# Patient Record
Sex: Male | Born: 2000 | Race: Black or African American | Hispanic: No | Marital: Single | Smoking: Never smoker
Health system: Southern US, Community
[De-identification: ages and names within clinical notes are randomized; demographics above are authoritative.]

## PROBLEM LIST (undated history)

## (undated) DIAGNOSIS — G8190 Hemiplegia, unspecified affecting unspecified side: Secondary | ICD-10-CM

## (undated) DIAGNOSIS — S069X9A Unspecified intracranial injury with loss of consciousness of unspecified duration, initial encounter: Secondary | ICD-10-CM

## (undated) DIAGNOSIS — S069XAA Unspecified intracranial injury with loss of consciousness status unknown, initial encounter: Secondary | ICD-10-CM

---
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
## 2023-01-20 ENCOUNTER — Emergency Department (HOSPITAL_COMMUNITY)
Admission: EM | Admit: 2023-01-20 | Discharge: 2023-01-20 | Disposition: A | Discharge status: Home / Self Care | Payer: Medicaid Other | Attending: Emergency Medicine | Admitting: Emergency Medicine

## 2023-01-20 ENCOUNTER — Other Ambulatory Visit: Payer: Self-pay

## 2023-01-20 DIAGNOSIS — L250 Unspecified contact dermatitis due to cosmetics: Secondary | ICD-10-CM

## 2023-01-20 DIAGNOSIS — R21 Rash and other nonspecific skin eruption: Secondary | ICD-10-CM | POA: Diagnosis present

## 2023-01-20 MED ORDER — MUPIROCIN CALCIUM 2 % EX CREA: 1.0000 | TOPICAL_CREAM | Freq: Two times a day (BID) | CUTANEOUS | 0 refills | Status: DC

## 2023-01-20 NOTE — ED Triage Notes (Signed)
Patient reports skin irritation/rashes at left axilla onset last week .

## 2023-01-20 NOTE — ED Provider Notes (Signed)
[place] EMERGENCY DEPARTMENT AT [facility] Provider Note   CSN: [id number] Arrival date & time: 01/20/23  2030     History  Chief Complaint  Patient presents with   Rash    Roger Washington is a 22 y.o. male.  Is ER with rash to left axilla labs 1 week, states it started when he changed deodorants and he noticed an irritated area, after couple of days he switched back to his regular deodorant but states area is now open and burning, denies any drainage or fevers.     Rash      Home Medications Prior to Admission medications   Medication Sig Start Date End Date Taking? Authorizing Provider  mupirocin cream (BACTROBAN) 2 % Apply 1 Application topically 2 (two) times daily. 01/20/23  Yes Gabrielle Mester A, PA-C  ibuprofen (ADVIL,MOTRIN) 400 MG tablet Take 1 tablet (400 mg total) by mouth every 6 (six) hours as needed. 03/16/18   Elpidio Anis, PA-C  OnabotulinumtoxinA (BOTOX IJ) Inject as directed. For left leg paralysis. Possibly at [facility]. Dr. Gwen Pounds.    [provider]  ondansetron (ZOFRAN ODT) 4 MG disintegrating tablet Take 1 tablet (4 mg total) by mouth every 8 (eight) hours as needed for nausea or vomiting. 09/09/18   Ree Shay, MD      Allergies    Patient has no known allergies.    Review of Systems   Review of Systems  Skin:  Positive for rash.    Physical Exam Updated Vital Signs BP 111/60 (BP Location: Right Arm)   Pulse 84   Temp 98.5 F (36.9 C) (Oral)   Resp 16   SpO2 99%  Physical Exam Vitals and nursing note reviewed.  Constitutional:      General: He is not in acute distress.    Appearance: He is well-developed.  HENT:     Head: Normocephalic and atraumatic.  Eyes:     Conjunctiva/sclera: Conjunctivae normal.  Cardiovascular:     Rate and Rhythm: Normal rate and regular rhythm.     Heart sounds: No murmur heard. Pulmonary:     Effort: Pulmonary effort is normal. No respiratory distress.     Breath  sounds: Normal breath sounds.  Abdominal:     Palpations: Abdomen is soft.     Tenderness: There is no abdominal tenderness.  Musculoskeletal:        General: No swelling.     Cervical back: Neck supple.  Skin:    General: Skin is warm and dry.     Capillary Refill: Capillary refill takes less than 2 seconds.     Comments: 1.5 cm area of open skin, no drainage, very mild overlying erythema, no drainage or induration.  There is mild tenderness.  Appears to have been prior vesicle and has ruptured  Neurological:     Mental Status: He is alert.  Psychiatric:        Mood and Affect: Mood normal.     ED Results / Procedures / Treatments   Labs (all labs ordered are listed, but only abnormal results are displayed) Labs Reviewed - No data to display  EKG None  Radiology No results found.  Procedures Procedures    Medications Ordered in ED Medications - No data to display  ED Course/ Medical Decision Making/ A&P                             Medical Decision  Making DDx: Contact dermatitis, allergic dermatitis, zoster, cellulitis, abscess, other  ED course: Patient here with rash to left axilla, appears to be a vesicle that has ruptured.  There is no crusting, no induration or drainage, he states it burns when anything touches it.  No other rashes, the right axilla is unaffected, no fever or chills, there is very mild erythema.  No associated adenopathy, no crusting or drainage.  I discussed that this may represent a healing area of contact dermatitis, given the mild erythema we will give him some bacitracin, keep the area clean and dry, come back if he has any swelling, drainage, increased pain or fevers or any other worsening symptoms.   Risk Prescription drug management.           Final Clinical Impression(s) / ED Diagnoses Final diagnoses:  Contact dermatitis due to cosmetics, unspecified contact dermatitis type    Rx / DC Orders ED Discharge Orders           Ordered    mupirocin cream (BACTROBAN) 2 %  2 times daily        01/20/23 2047              Josem Kaufmann 01/20/23 2123    Bethann Berkshire, MD 01/23/23 1132

## 2023-01-20 NOTE — Discharge Instructions (Addendum)
Likely broke out due to new deodorant.  Make sure you continue with your previous deodorant.  Since skin is open there I prescribed mupirocin ointment to help soothe the area and treat any mild infection.  If you get swelling, fevers, drainage or increased pain come back, otherwise follow-up close with your primary care doctor.  Keep the area clean and dry, avoid fabric rubbing/chaffing the area

## 2023-03-07 ENCOUNTER — Other Ambulatory Visit (HOSPITAL_COMMUNITY): Payer: Self-pay

## 2023-03-07 ENCOUNTER — Telehealth: Payer: Self-pay

## 2023-03-07 NOTE — Telephone Encounter (Signed)
[facility] Patient Advocate Encounter  Insurance verification completed.    The patient is insured through [place] [employer] and has a $0.00 copay.  We will continue to follow to see if copay assistance is needed.  Maryum Batterson, CPhT Specialty Pharmacy Patient Advocate [facility] Phone: [phone] Fax:  [phone]  

## 2023-03-14 ENCOUNTER — Ambulatory Visit: Payer: Medicaid Other

## 2023-03-14 ENCOUNTER — Encounter: Payer: Self-pay | Admitting: Family

## 2023-03-14 ENCOUNTER — Other Ambulatory Visit (HOSPITAL_COMMUNITY): Payer: Self-pay

## 2023-03-14 ENCOUNTER — Ambulatory Visit (INDEPENDENT_AMBULATORY_CARE_PROVIDER_SITE_OTHER): Payer: Medicaid Other | Admitting: Family

## 2023-03-14 ENCOUNTER — Other Ambulatory Visit: Payer: Self-pay

## 2023-03-14 ENCOUNTER — Telehealth: Payer: Self-pay

## 2023-03-14 ENCOUNTER — Ambulatory Visit (INDEPENDENT_AMBULATORY_CARE_PROVIDER_SITE_OTHER): Payer: Medicaid Other | Admitting: Pharmacist

## 2023-03-14 VITALS — BP 104/66 | HR 75 | Resp 16 | Ht 62.0 in | Wt 101.0 lb

## 2023-03-14 DIAGNOSIS — B20 Human immunodeficiency virus [HIV] disease: Secondary | ICD-10-CM | POA: Diagnosis present

## 2023-03-14 DIAGNOSIS — Z113 Encounter for screening for infections with a predominantly sexual mode of transmission: Secondary | ICD-10-CM

## 2023-03-14 DIAGNOSIS — Z79899 Other long term (current) drug therapy: Secondary | ICD-10-CM

## 2023-03-14 LAB — CBC WITH DIFFERENTIAL/PLATELET
Basophils Relative: 0.5 %
Eosinophils Absolute: 100 cells/uL (ref 15–500)
HCT: 40.3 % (ref 38.5–50.0)
Hemoglobin: 13.7 g/dL (ref 13.2–17.1)
Neutrophils Relative %: 63.4 %
RBC: 4.96 10*6/uL (ref 4.20–5.80)
Total Lymphocyte: 25.9 %
WBC: 3.7 10*3/uL — ABNORMAL LOW (ref 3.8–10.8)

## 2023-03-14 MED ORDER — BIKTARVY 50-200-25 MG PO TABS
1.0000 | ORAL_TABLET | Freq: Every day | ORAL | 2 refills | Status: DC
  Filled 2023-03-14 (×2): qty 30, 30d supply, fill #0
  Filled 2023-04-09: qty 30, 30d supply, fill #1
  Filled 2023-05-07: qty 30, 30d supply, fill #2

## 2023-03-14 NOTE — Assessment & Plan Note (Signed)
[name] is a 22 y/o AA male newly diagnosed with HIV disease with unclear risk factor and initial viral load of 315,000. No current symptoms. Discussed the basics of HIV including transmission, risks if left untreated, treatment options, lab work, financial assistance, and plan of care. Introduced to Engineer, technical sales, counseling, Artist, and Engineer, materials. Check lab work. Rapid start with Biktarvy. Met with Pharmacy Team as well. Plan for follow up in 1 month or sooner if needed.

## 2023-03-14 NOTE — Progress Notes (Signed)
Brief Narrative   Patient ID: Roger Washington, male    DOB: 04-04-2001, 22 y.o.   MRN: [id number]  [name] is a 22 y/o AA male diagnosed with HIV-1 disease in May 2024 with unknown risk factor. Initial viral load of 315,000 with CD4 count and remaining lab work pending. No opportunistic infection. Rapid start with Biktarvy.   Subjective:    Chief Complaint  Patient presents with   Establish Care    Prefers name brand drugs reacts neg to generic meds.    HIV Positive/AIDS    HPI:  [name] is a 22 y.o. 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   [name] is here today with his mother with his permission. Was recently seen for a primary care office visit and subsequently was positive for HIV. Lab work received shows a viral load of 315,000 from 02/14/23. Last negative HIV testing was likely with donated blood about 1 year ago. No current symptoms and denies any flu like illness recently. Has completed high school and is currently working detailing cars. No history of mental health concerns. Mother questions error in lab work. Has had family diagnosed with HIV that are now deceased.  Denies fevers, chills, night sweats, headaches, changes in vision, neck pain/stiffness, nausea, diarrhea, vomiting, lesions or rashes.  No Known Allergies    Outpatient Medications Prior to Visit  Medication Sig Dispense Refill   ibuprofen (ADVIL,MOTRIN) 400 MG tablet Take 1 tablet (400 mg total) by mouth every 6 (six) hours as needed. (Patient not taking: Reported on 03/14/2023) 30 tablet 0   mupirocin cream (BACTROBAN) 2 % Apply 1 Application topically 2 (two) times daily. (Patient not taking: Reported on 03/14/2023) 15 g 0   OnabotulinumtoxinA (BOTOX IJ) Inject as directed. For left leg paralysis. Possibly at [facility]. Dr. Gwen Pounds. (Patient not taking: Reported on 03/14/2023)      ondansetron (ZOFRAN ODT) 4 MG disintegrating tablet Take 1 tablet (4 mg total) by mouth every 8 (eight) hours as needed for nausea or vomiting. (Patient not taking: Reported on 03/14/2023) 8 tablet 0   No facility-administered medications prior to visit.     Past Medical History:  Diagnosis Date   Hemiparesis (HCC)    TBI (traumatic brain injury) (HCC)      History reviewed. No pertinent surgical history.    Review of Systems  Constitutional:  Negative for appetite change, chills, fatigue, fever and unexpected weight change.  Eyes:  Negative for visual disturbance.  Respiratory:  Negative for cough, chest tightness, shortness of breath and wheezing.   Cardiovascular:  Negative for chest pain and leg swelling.  Gastrointestinal:  Negative for abdominal pain, constipation, diarrhea, nausea and vomiting.  Genitourinary:  Negative for dysuria, flank pain, frequency, genital sores, hematuria and urgency.  Skin:  Negative for rash.  Allergic/Immunologic: Negative for immunocompromised state.  Neurological:  Negative for dizziness and headaches.      Objective:    BP 104/66   Pulse 75   Resp 16   Ht 5\' 2"  (1.575 m)   Wt 101 lb (45.8 kg)   SpO2 98%   BMI 18.47 kg/m  Nursing note and vital signs reviewed.  Physical Exam Constitutional:      General: He is not in acute distress.    Appearance: He is well-developed.  Eyes:     Conjunctiva/sclera: Conjunctivae normal.  Cardiovascular:     Rate and Rhythm:  Normal rate and regular rhythm.     Heart sounds: Normal heart sounds. No murmur heard.    No friction rub. No gallop.  Pulmonary:     Effort: Pulmonary effort is normal. No respiratory distress.     Breath sounds: Normal breath sounds. No wheezing or rales.  Chest:     Chest wall: No tenderness.  Abdominal:     General: Bowel sounds are normal.     Palpations: Abdomen is soft.     Tenderness: There is no abdominal tenderness.  Musculoskeletal:     Cervical back: Neck  supple.  Lymphadenopathy:     Cervical: No cervical adenopathy.  Skin:    General: Skin is warm and dry.     Findings: No rash.  Neurological:     Mental Status: He is alert and oriented to person, place, and time.  Psychiatric:        Behavior: Behavior normal.        Thought Content: Thought content normal.        Judgment: Judgment normal.         03/14/2023    9:23 AM  Depression screen PHQ 2/9  Decreased Interest 0  Down, Depressed, Hopeless 0  PHQ - 2 Score 0       Assessment & Plan:    Patient Active Problem List   Diagnosis Date Noted   HIV disease (HCC) 03/14/2023   Scoliosis of thoracolumbar spine 03/04/2017   Traumatic subdural hematoma, sequela (HCC) 03/04/2017   Anxiety and depression 03/04/2017   Spastic hemiplegia affecting nondominant side (HCC) 12/01/2013   Closed TBI (traumatic brain injury) (HCC) 12/01/2013   History of seizures 12/01/2013   Muscle spasticity 12/01/2013   Problems with learning 12/01/2013   Alteration of awareness 12/01/2013     Problem List Items Addressed This Visit       Other   HIV disease (HCC) - Primary    [name] is a 22 y/o AA male newly diagnosed with HIV disease with unclear risk factor and initial viral load of 315,000. No current symptoms. Discussed the basics of HIV including transmission, risks if left untreated, treatment options, lab work, financial assistance, and plan of care. Introduced to Engineer, technical sales, counseling, Artist, and Engineer, materials. Check lab work. Rapid start with Biktarvy. Met with Pharmacy Team as well. Plan for follow up in 1 month or sooner if needed.        Relevant Medications   bictegravir-emtricitabine-tenofovir AF (BIKTARVY) 50-200-25 MG TABS tablet   Other Relevant Orders   CBC WITH DIFFERENTIAL/PLATELET   COMPLETE METABOLIC PANEL WITH GFR   HEPATITIS B SURFACE ANTIGEN   HEPATITIS B SURFACE ANTIBODY   HEPATITIS B CORE ANTIBODY, TOTAL    HEPATITIS C ANTIBODY   HEPATITIS A ANTIBODY, TOTAL   URINALYSIS   HIV antibody   HLA B*5701   QuantiFERON-TB Gold Plus   HIV RNA, RTPCR W/R GT (RTI, PI,INT)   T-helper cells (CD4) count (not at [facility])   Other Visit Diagnoses     Pharmacologic therapy       Relevant Orders   LIPID PANEL   Screening for STDs (sexually transmitted diseases)       Relevant Orders   RPR        I have discontinued Chastin Roger Washington's OnabotulinumtoxinA (BOTOX IJ), ibuprofen, ondansetron, and mupirocin cream. I am also having him start on Biktarvy.   Meds ordered this encounter  Medications   bictegravir-emtricitabine-tenofovir AF (BIKTARVY) 50-200-25 MG  TABS tablet    Sig: Take 1 tablet by mouth daily.    Dispense:  30 tablet    Refill:  2    Order Specific Question:   Supervising Provider    Answer:   Judyann Munson [[id number]]     Follow-up: Return in about 1 month (around 04/13/2023), or if symptoms worsen or fail to improve.   Marcos Eke, MSN, FNP-C Nurse Practitioner [facility] for Infectious Disease [facility] Main number: [phone]

## 2023-03-14 NOTE — Progress Notes (Signed)
HPI: Roger Washington is a 22 y.o. male who presents to the [facility] clinic today to initiate care for a newly diagnosed HIV infection.  Patient Active Problem List   Diagnosis Date Noted   Scoliosis of thoracolumbar spine 03/04/2017   Traumatic subdural hematoma, sequela (HCC) 03/04/2017   Anxiety and depression 03/04/2017   Spastic hemiplegia affecting nondominant side (HCC) 12/01/2013   Closed TBI (traumatic brain injury) (HCC) 12/01/2013   History of seizures 12/01/2013   Muscle spasticity 12/01/2013   Problems with learning 12/01/2013   Alteration of awareness 12/01/2013    Patient's Medications  New Prescriptions   No medications on file  Previous Medications   BICTEGRAVIR-EMTRICITABINE-TENOFOVIR AF (BIKTARVY) 50-200-25 MG TABS TABLET    Take 1 tablet by mouth daily.  Modified Medications   No medications on file  Discontinued Medications   No medications on file    Allergies: No Known Allergies  Past Medical History: Past Medical History:  Diagnosis Date   Hemiparesis (HCC)    TBI (traumatic brain injury) (HCC)     Social History: Social History   Socioeconomic History   Marital status: Single    Spouse name: Single   Number of children: 0   Years of education: 12   Highest education level: Not on file  Occupational History   Not on file  Tobacco Use   Smoking status: Never    Passive exposure: Never   Smokeless tobacco: Never  Vaping Use   Vaping Use: Never used  Substance and Sexual Activity   Alcohol use: No   Drug use: No   Sexual activity: Not on file  Other Topics Concern   Not on file  Social History Narrative   He lives with his mom. He has one brother.   He enjoys video games, football, basketball.   Working detail cars.    Social Determinants of Health   Financial Resource Strain: Not on file  Food Insecurity: Not on file  Transportation Needs: Not on file  Physical Activity: Not on file  Stress: Not on file  Social Connections:  Not on file    Labs: No results found for: "HIV1RNAQUANT", "HIV1RNAVL", "CD4TABS"  RPR and STI No results found for: "LABRPR", "RPRTITER"      No data to display          Hepatitis B No results found for: "HEPBSAB", "HEPBSAG", "HEPBCAB" Hepatitis C No results found for: "HEPCAB", "HCVRNAPCRQN" Hepatitis A No results found for: "HAV" Lipids: No results found for: "CHOL", "TRIG", "HDL", "CHOLHDL", "VLDL", "LDLCALC"  Current HIV Regimen: Treatment naive  Assessment: Roger Washington is here today with his mother Roger Washington to initiate care with Roger Washington for his newly diagnosed HIV infection.  He is treatment naive with an unknown HIV viral load and CD4 count; these will be assessed today.  Will start patient on Biktarvy pending further lab work. Medication covered through [facility]; he would like to have it mailed through [facility].   Explained that Roger Washington is a one pill once daily medication with or without food and the importance of not missing any doses. Explained resistance and how it develops and why it is so important to take Biktarvy daily and not skip days or doses. Counseled patient to take it around the same time each day. Counseled on what to do if dose is missed, if closer to missed dose take immediately, if closer to next dose then skip and resume normal schedule. Cautioned on possible side effects the first week or so  including nausea, diarrhea, dizziness, and headaches but that they should resolve after the first couple of weeks. I reviewed patient medications and found no drug interactions. Counseled patient to separate Biktarvy from divalent cations including multivitamins. Discussed with patient to call clinic if he starts a new medication or herbal supplement. I gave the patient my card and told him to call me with any issues/questions/concerns.  Plan: Start Roger Washington  Counseled patient on Biktarvy  Margarite Gouge, PharmD, CPP, [place], AAHIVP Clinical Pharmacist Practitioner Infectious  Diseases Clinical Pharmacist [facility] 03/14/2023, 10:18 AM

## 2023-03-14 NOTE — Patient Instructions (Addendum)
Nice to see you.  We will check your lab work today.  Continue to take your medication daily as prescribed.  Plan for follow up in 1 months or sooner if needed with lab work on the same day.  Have a great day and stay safe!  

## 2023-03-14 NOTE — Telephone Encounter (Signed)
Patient in office today to see [name], NP.  Patient started feeling dizzy while getting labs drawn and reported he had not had anything to drink today.  Patient was given water and stated he was feeling better and was ok to go home.  BP 96/62 Pulse 58 Roger Washington

## 2023-03-15 LAB — COMPLETE METABOLIC PANEL WITH GFR
AG Ratio: 1.7 (calc) (ref 1.0–2.5)
AST: 21 U/L (ref 10–40)
Alkaline phosphatase (APISO): 94 U/L (ref 36–130)
BUN: 11 mg/dL (ref 7–25)
CO2: 29 mmol/L (ref 20–32)
Creat: 0.83 mg/dL (ref 0.60–1.24)
Potassium: 3.9 mmol/L (ref 3.5–5.3)
Total Bilirubin: 0.4 mg/dL (ref 0.2–1.2)
eGFR: 128 mL/min/{1.73_m2} (ref >=60)

## 2023-03-15 LAB — LIPID PANEL
Total CHOL/HDL Ratio: 2.7 (calc) (ref <5.0)
Triglycerides: 75 mg/dL (ref <150)

## 2023-03-15 LAB — HEPATITIS C ANTIBODY: Hepatitis C Ab: NONREACTIVE

## 2023-03-15 LAB — HEPATITIS B CORE ANTIBODY, TOTAL: Hep B Core Total Ab: NONREACTIVE

## 2023-03-15 LAB — HEPATITIS B SURFACE ANTIGEN: Hepatitis B Surface Ag: NONREACTIVE

## 2023-03-16 LAB — QUANTIFERON-TB GOLD PLUS: NIL: 0.04 IU/mL

## 2023-03-16 LAB — T-HELPER CELLS (CD4) COUNT (NOT AT ARMC)
Absolute CD4: 222 cells/uL — ABNORMAL LOW (ref 490–1740)
CD4 T Helper %: 24 % — ABNORMAL LOW (ref 30–61)
Total lymphocyte count: 941 cells/uL (ref 850–3900)

## 2023-03-17 LAB — HIV-1/2 AB - DIFFERENTIATION
HIV-1 antibody: POSITIVE — AB
HIV-2 Ab: NEGATIVE

## 2023-03-17 LAB — HIV ANTIBODY (ROUTINE TESTING W REFLEX): HIV 1&2 Ab, 4th Generation: REACTIVE — AB

## 2023-03-18 LAB — HIV RNA, RTPCR W/R GT (RTI, PI,INT)
HIV 1 RNA Quant: 161000 copies/mL — ABNORMAL HIGH
HIV-1 RNA Quant, Log: 5.21 Log copies/mL — ABNORMAL HIGH

## 2023-03-25 LAB — CBC WITH DIFFERENTIAL/PLATELET
Absolute Monocytes: 278 cells/uL (ref 200–950)
Basophils Absolute: 19 cells/uL (ref 0–200)
Eosinophils Relative: 2.7 %
Lymphs Abs: 958 cells/uL (ref 850–3900)
MCH: 27.6 pg (ref 27.0–33.0)
MCHC: 34 g/dL (ref 32.0–36.0)
MCV: 81.3 fL (ref 80.0–100.0)
MPV: 9.4 fL (ref 7.5–12.5)
Monocytes Relative: 7.5 %
Neutro Abs: 2346 cells/uL (ref 1500–7800)
Platelets: 198 10*3/uL (ref 140–400)
RDW: 13.2 % (ref 11.0–15.0)

## 2023-03-25 LAB — HIV-1 INTEGRASE GENOTYPE

## 2023-03-25 LAB — COMPLETE METABOLIC PANEL WITH GFR
ALT: 26 U/L (ref 9–46)
Albumin: 4.5 g/dL (ref 3.6–5.1)
Calcium: 9.7 mg/dL (ref 8.6–10.3)
Chloride: 105 mmol/L (ref 98–110)
Globulin: 2.7 g/dL (calc) (ref 1.9–3.7)
Glucose, Bld: 72 mg/dL (ref 65–99)
Sodium: 141 mmol/L (ref 135–146)
Total Protein: 7.2 g/dL (ref 6.1–8.1)

## 2023-03-25 LAB — QUANTIFERON-TB GOLD PLUS
Mitogen-NIL: 6.64 IU/mL
QuantiFERON-TB Gold Plus: NEGATIVE
TB1-NIL: 0.01 IU/mL
TB2-NIL: 0.01 IU/mL

## 2023-03-25 LAB — RPR: RPR Ser Ql: NONREACTIVE

## 2023-03-25 LAB — HEPATITIS B SURFACE ANTIBODY,QUALITATIVE: Hep B S Ab: NONREACTIVE

## 2023-03-25 LAB — HEPATITIS A ANTIBODY, TOTAL: Hepatitis A AB,Total: REACTIVE — AB

## 2023-03-25 LAB — LIPID PANEL
Cholesterol: 112 mg/dL (ref <200)
HDL: 41 mg/dL (ref >=40)
LDL Cholesterol (Calc): 55 mg/dL (calc)
Non-HDL Cholesterol (Calc): 71 mg/dL (calc) (ref <130)

## 2023-03-25 LAB — HLA B*5701: HLA-B*5701 w/rflx HLA-B High: NEGATIVE

## 2023-03-25 LAB — HIV-1 GENOTYPE: HIV-1 Genotype: DETECTED — AB

## 2023-04-03 ENCOUNTER — Other Ambulatory Visit (HOSPITAL_COMMUNITY): Payer: Self-pay

## 2023-04-09 ENCOUNTER — Other Ambulatory Visit (HOSPITAL_COMMUNITY): Payer: Self-pay

## 2023-04-10 ENCOUNTER — Ambulatory Visit: Payer: MEDICAID | Admitting: Family

## 2023-04-21 ENCOUNTER — Encounter: Payer: Self-pay | Admitting: Family

## 2023-04-21 ENCOUNTER — Ambulatory Visit (INDEPENDENT_AMBULATORY_CARE_PROVIDER_SITE_OTHER): Payer: MEDICAID | Admitting: Family

## 2023-04-21 ENCOUNTER — Other Ambulatory Visit: Payer: Self-pay

## 2023-04-21 VITALS — BP 116/69 | HR 86 | Temp 98.7°F | Resp 16 | Wt 103.2 lb

## 2023-04-21 DIAGNOSIS — B20 Human immunodeficiency virus [HIV] disease: Secondary | ICD-10-CM

## 2023-04-21 DIAGNOSIS — Z0000 Encounter for general adult medical examination without abnormal findings: Secondary | ICD-10-CM | POA: Insufficient documentation

## 2023-04-21 NOTE — Assessment & Plan Note (Signed)
Altariq has been doing well since his last office visit with good adherence and tolerance to [employer].  Reviewed previous lab work and discussed plan of care, U equals U, and family-planning.  Check blood work.  Continue current dose of Biktarvy.  Plan for follow-up in 6 weeks or sooner if needed with lab work on the same day.

## 2023-04-21 NOTE — Assessment & Plan Note (Signed)
Discussed importance of safe sexual practice and condom use. Condoms and STD testing offered.  Reviewed vaccinations and will consider at next office visit.

## 2023-04-21 NOTE — Progress Notes (Signed)
Brief Narrative   Patient ID: Roger Washington, male    DOB: 2001/04/23, 21 y.o.   MRN: [id number]  Mr. Ketner is a 22 y/o AA male diagnosed with HIV-1 disease in May 2024 with unknown risk factor. Initial viral load of 315,000 with CD4 count  222.  Enters care at [facility] stage 2.  Genotype with no significant medication resistance mutations.  [id number] and Quantiferon Gold negative. No history of opportunistic infection. Rapid start with Biktarvy.   Subjective:    Chief Complaint  Patient presents with   Follow-up    B20     HPI:  Roger Washington is a 22 y.o. male with HIV disease last seen on 03/14/2023 with newly diagnosed virus and started on Biktarvy.  Initial viral load 315,000 with CD4 count of 222.  Genotype with no significant medication resistance mutations.  QuantiFERON gold and HLAB5701 negative.  Here today for initial follow-up.  Roger Washington is present with his mother with his permission.  Has been doing well since his last office visit with no new concerns/complaints.  Taking medication as prescribed and no adverse side effects or problems obtaining medication from the pharmacy.  Condoms and STD testing offered.  Vaccinations reviewed.  Denies fevers, chills, night sweats, headaches, changes in vision, neck pain/stiffness, nausea, diarrhea, vomiting, lesions or rashes.   No Known Allergies   Outpatient Medications Prior to Visit  Medication Sig Dispense Refill   bictegravir-emtricitabine-tenofovir AF (BIKTARVY) 50-200-25 MG TABS tablet Take 1 tablet by mouth daily. 30 tablet 2   No facility-administered medications prior to visit.     Past Medical History:  Diagnosis Date   Hemiparesis (HCC)    TBI (traumatic brain injury) (HCC)      History reviewed. No pertinent surgical history.    Review of Systems  Constitutional:  Negative for appetite change, chills, fatigue, fever and unexpected weight change.  Eyes:  Negative for visual disturbance.  Respiratory:  Negative  for cough, chest tightness, shortness of breath and wheezing.   Cardiovascular:  Negative for chest pain and leg swelling.  Gastrointestinal:  Negative for abdominal pain, constipation, diarrhea, nausea and vomiting.  Genitourinary:  Negative for dysuria, flank pain, frequency, genital sores, hematuria and urgency.  Skin:  Negative for rash.  Allergic/Immunologic: Negative for immunocompromised state.  Neurological:  Negative for dizziness and headaches.      Objective:    BP 116/69   Pulse 86   Temp 98.7 F (37.1 C) (Temporal)   Resp 16   Wt 103 lb 3.2 oz (46.8 kg)   SpO2 94%   BMI 18.88 kg/m  Nursing note and vital signs reviewed.  Physical Exam Constitutional:      General: He is not in acute distress.    Appearance: He is well-developed.  Eyes:     Conjunctiva/sclera: Conjunctivae normal.  Cardiovascular:     Rate and Rhythm: Normal rate and regular rhythm.     Heart sounds: Normal heart sounds. No murmur heard.    No friction rub. No gallop.  Pulmonary:     Effort: Pulmonary effort is normal. No respiratory distress.     Breath sounds: Normal breath sounds. No wheezing or rales.  Chest:     Chest wall: No tenderness.  Abdominal:     General: Bowel sounds are normal.     Palpations: Abdomen is soft.     Tenderness: There is no abdominal tenderness.  Musculoskeletal:     Cervical back: Neck supple.  Lymphadenopathy:  Cervical: No cervical adenopathy.  Skin:    General: Skin is warm and dry.     Findings: No rash.  Neurological:     Mental Status: He is alert and oriented to person, place, and time.  Psychiatric:        Behavior: Behavior normal.        Thought Content: Thought content normal.        Judgment: Judgment normal.         04/21/2023    3:56 PM 03/14/2023    9:23 AM  Depression screen PHQ 2/9  Decreased Interest 0 0  Down, Depressed, Hopeless 0 0  PHQ - 2 Score 0 0       Assessment & Plan:    Patient Active Problem List    Diagnosis Date Noted   Healthcare maintenance 04/21/2023   HIV disease (HCC) 03/14/2023   Scoliosis of thoracolumbar spine 03/04/2017   Traumatic subdural hematoma, sequela (HCC) 03/04/2017   Anxiety and depression 03/04/2017   Spastic hemiplegia affecting nondominant side (HCC) 12/01/2013   Closed TBI (traumatic brain injury) (HCC) 12/01/2013   History of seizures 12/01/2013   Muscle spasticity 12/01/2013   Problems with learning 12/01/2013   Alteration of awareness 12/01/2013     Problem List Items Addressed This Visit       Other   HIV disease (HCC) - Primary    Michaeljoseph has been doing well since his last office visit with good adherence and tolerance to [employer].  Reviewed previous lab work and discussed plan of care, U equals U, and family-planning.  Check blood work.  Continue current dose of Biktarvy.  Plan for follow-up in 6 weeks or sooner if needed with lab work on the same day.      Relevant Orders   BASIC METABOLIC PANEL WITH GFR   HIV-1 RNA quant-no reflex-bld   T-helper cell (CD4)- ([facility] clinic only)   Healthcare maintenance    Discussed importance of safe sexual practice and condom use. Condoms and STD testing offered.  Reviewed vaccinations and will consider at next office visit.         I am having [name] maintain his Biktarvy.   No orders of the defined types were placed in this encounter.    Follow-up: Return in about 6 weeks (around 06/02/2023), or if symptoms worsen or fail to improve.   Marcos Eke, MSN, FNP-C Nurse Practitioner [facility] for Infectious Disease [facility] Main number: [phone]

## 2023-04-21 NOTE — Patient Instructions (Addendum)
Nice to see you.  We will check your lab work today.  Continue to take your medication daily as prescribed.  Refills have been sent to the pharmacy.  Plan for follow up in 6 weeks or sooner if needed with lab work on the same day.  Have a great day and stay safe!  For Internal Medicine:  [email or web address]   [facility] Address: [address] [place], [place] [address] Phone: [phone]

## 2023-04-23 LAB — BASIC METABOLIC PANEL WITHOUT GFR
BUN: 10 mg/dL (ref 7–25)
CO2: 27 mmol/L (ref 20–32)
Calcium: 10.2 mg/dL (ref 8.6–10.3)
Chloride: 102 mmol/L (ref 98–110)
Creat: 0.94 mg/dL (ref 0.60–1.24)
Glucose, Bld: 111 mg/dL — ABNORMAL HIGH (ref 65–99)
Potassium: 3.7 mmol/L (ref 3.5–5.3)
Sodium: 139 mmol/L (ref 135–146)
eGFR: 119 mL/min/1.73m2

## 2023-04-23 LAB — T-HELPER CELL (CD4) - (RCID CLINIC ONLY)
CD4 % Helper T Cell: 28 % — ABNORMAL LOW (ref 33–65)
CD4 T Cell Abs: 316 /uL — ABNORMAL LOW (ref 400–1790)

## 2023-05-07 ENCOUNTER — Other Ambulatory Visit (HOSPITAL_COMMUNITY): Payer: Self-pay

## 2023-05-09 ENCOUNTER — Other Ambulatory Visit (HOSPITAL_COMMUNITY): Payer: Self-pay

## 2023-06-02 ENCOUNTER — Other Ambulatory Visit: Payer: Self-pay

## 2023-06-02 ENCOUNTER — Encounter: Payer: Self-pay | Admitting: Family

## 2023-06-02 ENCOUNTER — Ambulatory Visit (INDEPENDENT_AMBULATORY_CARE_PROVIDER_SITE_OTHER): Payer: MEDICAID | Admitting: Family

## 2023-06-02 ENCOUNTER — Other Ambulatory Visit (HOSPITAL_COMMUNITY): Payer: Self-pay

## 2023-06-02 ENCOUNTER — Other Ambulatory Visit (HOSPITAL_COMMUNITY)
Admission: RE | Admit: 2023-06-02 | Discharge: 2023-06-02 | Disposition: A | Discharge status: Home / Self Care | Payer: MEDICAID | Source: Ambulatory Visit | Attending: Family | Admitting: Family

## 2023-06-02 VITALS — BP 103/66 | HR 70 | Temp 98.2°F | Ht 62.0 in | Wt 100.0 lb

## 2023-06-02 DIAGNOSIS — Z113 Encounter for screening for infections with a predominantly sexual mode of transmission: Secondary | ICD-10-CM | POA: Diagnosis present

## 2023-06-02 DIAGNOSIS — B20 Human immunodeficiency virus [HIV] disease: Secondary | ICD-10-CM

## 2023-06-02 DIAGNOSIS — Z0000 Encounter for general adult medical examination without abnormal findings: Secondary | ICD-10-CM

## 2023-06-02 MED ORDER — BIKTARVY 50-200-25 MG PO TABS
1.0000 | ORAL_TABLET | Freq: Every day | ORAL | 5 refills | Status: DC
Start: 2023-06-02 — End: 2023-12-08
  Filled 2023-06-02 – 2023-06-17 (×2): qty 30, 30d supply, fill #0
  Filled 2023-08-12: qty 30, 30d supply, fill #1
  Filled 2023-09-15 – 2023-09-16 (×2): qty 30, 30d supply, fill #2
  Filled 2023-10-08: qty 30, 30d supply, fill #3
  Filled 2023-11-21: qty 30, 30d supply, fill #4

## 2023-06-02 NOTE — Patient Instructions (Addendum)
Nice to see you.  We will check your lab work today.  Continue to take your medication daily as prescribed.  Refills have been sent to the pharmacy.  Plan for follow up in 3 months or sooner if needed with lab work on the same day.  Have a great day and stay safe!   For PRIMARY CARE:  [email or web address]  [facility] Address: [address]tr[address]; Ground Floor - [facility], [place], [place] [address] Phone: [phone]   [facility] [address] [place],  [place]  [address] Main: [phone]

## 2023-06-02 NOTE — Progress Notes (Signed)
Brief Narrative   Patient ID: Roger Washington, male    DOB: September 01, 2001, 22 y.o.   MRN: [id number]  Roger Washington is a 22 y/o AA male diagnosed with HIV-1 disease in May 2024 with unknown risk factor. Initial viral load of 315,000 with CD4 count  222.  Enters care at [facility] stage 2.  Genotype with no significant medication resistance mutations.  [id number] and Quantiferon Gold negative. No history of opportunistic infection. Rapid start with Biktarvy.   Subjective:    Chief Complaint  Patient presents with   Follow-up    B20    HPI:  Roger Washington is a 22 y.o. male with HIV disease last seen on 04/21/2023 with well-controlled virus and good adherence and tolerance to [employer].  Viral load was undetectable with CD4 count 316. Kidney function and electrolytes within normal ranges. Here today for follow up.   Roger Washington has been doing well since his last office visit and continues to take Roger Washington as prescribed with no adverse side effects or problems obtaining medication.  Currently seeking employment and looking for jobs.  No new concerns/complaints.  Condoms and STD testing offered.  Healthcare maintenance due includes influenza, pneumococcal, and meningococcal vaccines. Scheduling dental care for wisdom teeth.   Denies fevers, chills, night sweats, headaches, changes in vision, neck pain/stiffness, nausea, diarrhea, vomiting, lesions or rashes.  Lab Results  Component Value Date   CD4TCELL 28 (L) 04/21/2023   CD4TABS 316 (L) 04/21/2023   Lab Results  Component Value Date   HIV1RNAQUANT <20 (H) 04/21/2023     No Known Allergies    Outpatient Medications Prior to Visit  Medication Sig Dispense Refill   bictegravir-emtricitabine-tenofovir AF (BIKTARVY) 50-200-25 MG TABS tablet Take 1 tablet by mouth daily. 30 tablet 2   No facility-administered medications prior to visit.     Past Medical History:  Diagnosis Date   Hemiparesis (HCC)    TBI (traumatic brain injury) (HCC)       History reviewed. No pertinent surgical history.    Review of Systems  Constitutional:  Negative for appetite change, chills, fatigue, fever and unexpected weight change.  Eyes:  Negative for visual disturbance.  Respiratory:  Negative for cough, chest tightness, shortness of breath and wheezing.   Cardiovascular:  Negative for chest pain and leg swelling.  Gastrointestinal:  Negative for abdominal pain, constipation, diarrhea, nausea and vomiting.  Genitourinary:  Negative for dysuria, flank pain, frequency, genital sores, hematuria and urgency.  Skin:  Negative for rash.  Allergic/Immunologic: Negative for immunocompromised state.  Neurological:  Negative for dizziness and headaches.      Objective:    BP 103/66   Pulse 70   Temp 98.2 F (36.8 C) (Temporal)   Ht 5\' 2"  (1.575 m)   Wt 100 lb (45.4 kg)   SpO2 97%   BMI 18.29 kg/m  Nursing note and vital signs reviewed.  Physical Exam Constitutional:      General: He is not in acute distress.    Appearance: He is well-developed.  Eyes:     Conjunctiva/sclera: Conjunctivae normal.  Cardiovascular:     Rate and Rhythm: Normal rate and regular rhythm.     Heart sounds: Normal heart sounds. No murmur heard.    No friction rub. No gallop.  Pulmonary:     Effort: Pulmonary effort is normal. No respiratory distress.     Breath sounds: Normal breath sounds. No wheezing or rales.  Chest:     Chest wall: No tenderness.  Abdominal:  General: Bowel sounds are normal.     Palpations: Abdomen is soft.     Tenderness: There is no abdominal tenderness.  Musculoskeletal:     Cervical back: Neck supple.  Lymphadenopathy:     Cervical: No cervical adenopathy.  Skin:    General: Skin is warm and dry.     Findings: No rash.  Neurological:     Mental Status: He is alert and oriented to person, place, and time.  Psychiatric:        Behavior: Behavior normal.        Thought Content: Thought content normal.         Judgment: Judgment normal.         06/02/2023    2:59 PM 04/21/2023    3:56 PM 03/14/2023    9:23 AM  Depression screen PHQ 2/9  Decreased Interest 0 0 0  Down, Depressed, Hopeless 0 0 0  PHQ - 2 Score 0 0 0       Assessment & Plan:    Patient Active Problem List   Diagnosis Date Noted   Healthcare maintenance 04/21/2023   HIV disease (HCC) 03/14/2023   Scoliosis of thoracolumbar spine 03/04/2017   Traumatic subdural hematoma, sequela (HCC) 03/04/2017   Anxiety and depression 03/04/2017   Spastic hemiplegia affecting nondominant side (HCC) 12/01/2013   Closed TBI (traumatic brain injury) (HCC) 12/01/2013   History of seizures 12/01/2013   Muscle spasticity 12/01/2013   Problems with learning 12/01/2013   Alteration of awareness 12/01/2013     Problem List Items Addressed This Visit       Other   HIV disease (HCC) - Primary    Roger Washington continues to have well controlled virus with good adherence and tolerance to [employer].  Reviewed lab work and discussed plan of care, U equals U, and family planning. Check lab work. Continue current dose of Biktarvy. Plan for follow up in  3 months or sooner if needed with lab work on the same day.       Relevant Medications   bictegravir-emtricitabine-tenofovir AF (BIKTARVY) 50-200-25 MG TABS tablet   Other Relevant Orders   COMPLETE METABOLIC PANEL WITH GFR   HIV-1 RNA quant-no reflex-bld   T-helper cell (CD4)- ([facility] clinic only)   Healthcare maintenance    Discussed importance of safe sexual practice and condom use. Condoms and STD testing offered.  Declines vaccinations and will speak with his mother Scheduling dental care for wisdom teeth. Working to establish with Internal Medicine for primary care.       Other Visit Diagnoses     Screening for STDs (sexually transmitted diseases)       Relevant Orders   RPR   Urine cytology ancillary only        I am having Roger Washington maintain his [place].   Meds ordered this  encounter  Medications   bictegravir-emtricitabine-tenofovir AF (BIKTARVY) 50-200-25 MG TABS tablet    Sig: Take 1 tablet by mouth daily.    Dispense:  30 tablet    Refill:  5    Order Specific Question:   Supervising Provider    Answer:   Judyann Munson [[id number]]     Follow-up: Return in about 3 months (around 09/01/2023). or sooner if needed.    Marcos Eke, MSN, FNP-C Nurse Practitioner [facility] for Infectious Disease [facility] Main number: [phone]

## 2023-06-02 NOTE — Assessment & Plan Note (Signed)
Discussed importance of safe sexual practice and condom use. Condoms and STD testing offered.  Declines vaccinations and will speak with his mother Scheduling dental care for wisdom teeth. Working to establish with Internal Medicine for primary care.

## 2023-06-02 NOTE — Assessment & Plan Note (Signed)
Crist continues to have well controlled virus with good adherence and tolerance to [employer].  Reviewed lab work and discussed plan of care, U equals U, and family planning. Check lab work. Continue current dose of Biktarvy. Plan for follow up in  3 months or sooner if needed with lab work on the same day.

## 2023-06-03 LAB — URINE CYTOLOGY ANCILLARY ONLY
Chlamydia: NEGATIVE
Comment: NEGATIVE
Comment: NORMAL
Neisseria Gonorrhea: NEGATIVE

## 2023-06-04 LAB — T-HELPER CELL (CD4) - (RCID CLINIC ONLY)
CD4 % Helper T Cell: 36 % (ref 33–65)
CD4 T Cell Abs: 341 /uL — ABNORMAL LOW (ref 400–1790)

## 2023-06-04 LAB — COMPLETE METABOLIC PANEL WITH GFR
AG Ratio: 1.8 (calc) (ref 1.0–2.5)
ALT: 22 U/L (ref 9–46)
AST: 21 U/L (ref 10–40)
Albumin: 4.9 g/dL (ref 3.6–5.1)
Alkaline phosphatase (APISO): 79 U/L (ref 36–130)
BUN: 12 mg/dL (ref 7–25)
CO2: 31 mmol/L (ref 20–32)
Calcium: 10.2 mg/dL (ref 8.6–10.3)
Chloride: 103 mmol/L (ref 98–110)
Creat: 0.88 mg/dL (ref 0.60–1.24)
Globulin: 2.8 g/dL (ref 1.9–3.7)
Glucose, Bld: 79 mg/dL (ref 65–99)
Potassium: 3.7 mmol/L (ref 3.5–5.3)
Sodium: 142 mmol/L (ref 135–146)
Total Bilirubin: 0.5 mg/dL (ref 0.2–1.2)
Total Protein: 7.7 g/dL (ref 6.1–8.1)
eGFR: 126 mL/min/{1.73_m2} (ref >=60)

## 2023-06-04 LAB — RPR: RPR Ser Ql: NONREACTIVE

## 2023-06-04 LAB — HIV-1 RNA QUANT-NO REFLEX-BLD
HIV 1 RNA Quant: 5260 {copies}/mL — ABNORMAL HIGH
HIV-1 RNA Quant, Log: 3.72 {Log_copies}/mL — ABNORMAL HIGH

## 2023-06-17 ENCOUNTER — Other Ambulatory Visit: Payer: Self-pay

## 2023-06-17 ENCOUNTER — Other Ambulatory Visit (HOSPITAL_COMMUNITY): Payer: Self-pay

## 2023-06-17 ENCOUNTER — Other Ambulatory Visit: Payer: Self-pay | Admitting: Pharmacist

## 2023-06-17 NOTE — Progress Notes (Signed)
Specialty Pharmacy Ongoing Clinical Assessment Note  Roger Washington is a 22 y.o. male who is being followed by the specialty pharmacy service for RxSp HIV   Review of patient's specialty medication(s) Bictegravir-Emtricitab-Tenofov  occurred today.   Patient has missed 0  doses in the last 4 weeks.   Patient did not have any additional questions or concerns.   Therapeutic benefit summary: Patient is achieving benefit   Adverse events/side effects summary: No adverse events/side effects   Patient's therapy is appropriate to : Continue    Goals      Achieve Undetectable HIV Viral Load < 20     Patient is not on track and worsening. Patient will work on increased adherence      Increase CD4 count until steady state     Patient is on track . Patient will work on increased adherence      Maintain optimal adherence to therapy     Patient is on track . Patient will work on increased adherence         Follow up:  3 months

## 2023-06-17 NOTE — Progress Notes (Signed)
Specialty Pharmacy Refill Coordination Note  Roger Washington is a 22 y.o. male contacted today regarding refills of specialty medication(s) Bictegravir-Emtricitab-Tenofov .  Patient requested Delivery  on 06/18/23  to verified address [address]   [place] [address]   Medication will be filled on 06/17/2023.

## 2023-06-30 ENCOUNTER — Other Ambulatory Visit: Payer: Self-pay | Admitting: Family

## 2023-06-30 ENCOUNTER — Other Ambulatory Visit: Payer: MEDICAID

## 2023-06-30 ENCOUNTER — Other Ambulatory Visit: Payer: Self-pay

## 2023-06-30 DIAGNOSIS — B20 Human immunodeficiency virus [HIV] disease: Secondary | ICD-10-CM

## 2023-07-01 LAB — T-HELPER CELL (CD4) - (RCID CLINIC ONLY)
CD4 % Helper T Cell: 30 % — ABNORMAL LOW (ref 33–65)
CD4 T Cell Abs: 370 /uL — ABNORMAL LOW (ref 400–1790)

## 2023-07-02 LAB — HIV-1 RNA QUANT-NO REFLEX-BLD
HIV 1 RNA Quant: <20 {copies}/mL — ABNORMAL HIGH
HIV-1 RNA Quant, Log: <1.3 {Log_copies}/mL — ABNORMAL HIGH

## 2023-07-03 ENCOUNTER — Other Ambulatory Visit (HOSPITAL_COMMUNITY): Payer: Self-pay

## 2023-07-23 ENCOUNTER — Other Ambulatory Visit: Payer: Self-pay

## 2023-08-12 ENCOUNTER — Other Ambulatory Visit: Payer: Self-pay

## 2023-08-12 NOTE — Progress Notes (Signed)
Specialty Pharmacy Refill Coordination Note  Halford Spayd is a 22 y.o. male contacted today regarding refills of specialty medication(s) Bictegravir-Emtricitab-Tenofov   Patient requested Delivery   Delivery date: 08/22/23   Verified address: [address] [place] [address]   Medication will be filled on 08/20/23.

## 2023-08-20 ENCOUNTER — Other Ambulatory Visit: Payer: Self-pay

## 2023-08-25 ENCOUNTER — Other Ambulatory Visit: Payer: Self-pay

## 2023-08-25 ENCOUNTER — Encounter: Payer: Self-pay | Admitting: Family

## 2023-08-25 ENCOUNTER — Other Ambulatory Visit (HOSPITAL_COMMUNITY)
Admission: RE | Admit: 2023-08-25 | Discharge: 2023-08-25 | Disposition: A | Discharge status: Home / Self Care | Payer: MEDICAID | Source: Ambulatory Visit | Attending: Family | Admitting: Family

## 2023-08-25 ENCOUNTER — Ambulatory Visit (INDEPENDENT_AMBULATORY_CARE_PROVIDER_SITE_OTHER): Payer: MEDICAID | Admitting: Family

## 2023-08-25 VITALS — BP 112/72 | HR 79 | Temp 97.7°F | Ht 63.0 in | Wt 100.0 lb

## 2023-08-25 DIAGNOSIS — R110 Nausea: Secondary | ICD-10-CM

## 2023-08-25 DIAGNOSIS — B20 Human immunodeficiency virus [HIV] disease: Secondary | ICD-10-CM | POA: Insufficient documentation

## 2023-08-25 DIAGNOSIS — Z0000 Encounter for general adult medical examination without abnormal findings: Secondary | ICD-10-CM

## 2023-08-25 DIAGNOSIS — Z113 Encounter for screening for infections with a predominantly sexual mode of transmission: Secondary | ICD-10-CM | POA: Insufficient documentation

## 2023-08-25 NOTE — Progress Notes (Signed)
Brief Narrative   Patient ID: Roger Washington, male    DOB: 02-14-2001, 22 y.o.   MRN: [id number]  Roger Washington is a 22 y/o AA male diagnosed with HIV-1 disease in May 2024 with unknown risk factor. Initial viral load of 315,000 with CD4 count 222. Enters care at [facility] stage 2. Genotype with no significant medication resistance mutations. [id number] and Quantiferon Gold negative. No history of opportunistic infection. Rapid start with Biktarvy.   Subjective:    Chief Complaint  Patient presents with   Follow-up    Nausea after eating ever since having dental work done 11/26    HPI:  Roger Washington is a 22 y.o. male with HIV disease last seen on 06/02/23 with poorly controlled virus and good tolerance to [employer]. Viral load was 5,260 and CD4 count 341. Subsequent lab work on 06/30/23 with well controlled virus that was undetectable and CD4 count 370. Here today for routine follow up.  Roger Washington has been doing okay since his last office visit and continues to take [facility] as prescribed with no adverse side effects or problems obtaining medication from the pharmacy and is covered by Medicaid. Has been having nausea after eating recently following a dental procedure with concern about possibly swallowing something although was able to tolerate cereal this morning. No problems with nausea when taking medication. Condoms and STD testing offered. Healthcare maintenance reviewed. Had a partner that tested positive for chlamydia.   Denies fevers, chills, night sweats, headaches, changes in vision, neck pain/stiffness, nausea, diarrhea, vomiting, lesions or rashes.  Lab Results  Component Value Date   CD4TCELL 30 (L) 06/30/2023   CD4TABS 370 (L) 06/30/2023   Lab Results  Component Value Date   HIV1RNAQUANT <20 (H) 06/30/2023     No Known Allergies    Outpatient Medications Prior to Visit  Medication Sig Dispense Refill   bictegravir-emtricitabine-tenofovir AF (BIKTARVY) 50-200-25 MG TABS  tablet Take 1 tablet by mouth daily. 30 tablet 5   No facility-administered medications prior to visit.     Past Medical History:  Diagnosis Date   Hemiparesis (HCC)    TBI (traumatic brain injury) (HCC)      No past surgical history on file.    Review of Systems  Constitutional:  Negative for appetite change, chills, fatigue, fever and unexpected weight change.  Eyes:  Negative for visual disturbance.  Respiratory:  Negative for cough, chest tightness, shortness of breath and wheezing.   Cardiovascular:  Negative for chest pain and leg swelling.  Gastrointestinal:  Negative for abdominal pain, constipation, diarrhea, nausea and vomiting.  Genitourinary:  Negative for dysuria, flank pain, frequency, genital sores, hematuria and urgency.  Skin:  Negative for rash.  Allergic/Immunologic: Negative for immunocompromised state.  Neurological:  Negative for dizziness and headaches.      Objective:    BP 112/72   Pulse 79   Temp 97.7 F (36.5 C) (Temporal)   Ht 5\' 3"  (1.6 m)   Wt 100 lb (45.4 kg)   SpO2 99%   BMI 17.71 kg/m  Nursing note and vital signs reviewed.  Physical Exam Constitutional:      General: He is not in acute distress.    Appearance: He is well-developed.  Eyes:     Conjunctiva/sclera: Conjunctivae normal.  Cardiovascular:     Rate and Rhythm: Normal rate and regular rhythm.     Heart sounds: Normal heart sounds. No murmur heard.    No friction rub. No gallop.  Pulmonary:  Effort: Pulmonary effort is normal. No respiratory distress.     Breath sounds: Normal breath sounds. No wheezing or rales.  Chest:     Chest wall: No tenderness.  Abdominal:     General: Bowel sounds are normal.     Palpations: Abdomen is soft.     Tenderness: There is no abdominal tenderness.  Musculoskeletal:     Cervical back: Neck supple.  Lymphadenopathy:     Cervical: No cervical adenopathy.  Skin:    General: Skin is warm and dry.     Findings: No rash.   Neurological:     Mental Status: He is alert and oriented to person, place, and time.  Psychiatric:        Behavior: Behavior normal.        Thought Content: Thought content normal.        Judgment: Judgment normal.         08/25/2023    4:07 PM 06/02/2023    2:59 PM 04/21/2023    3:56 PM 03/14/2023    9:23 AM  Depression screen PHQ 2/9  Decreased Interest 0 0 0 0  Down, Depressed, Hopeless 0 0 0 0  PHQ - 2 Score 0 0 0 0       Assessment & Plan:    Patient Active Problem List   Diagnosis Date Noted   Nausea 08/25/2023   Healthcare maintenance 04/21/2023   HIV disease (HCC) 03/14/2023   Scoliosis of thoracolumbar spine 03/04/2017   Traumatic subdural hematoma, sequela (HCC) 03/04/2017   Anxiety and depression 03/04/2017   Spastic hemiplegia affecting nondominant side (HCC) 12/01/2013   Closed TBI (traumatic brain injury) (HCC) 12/01/2013   History of seizures 12/01/2013   Muscle spasticity 12/01/2013   Problems with learning 12/01/2013   Alteration of awareness 12/01/2013     Problem List Items Addressed This Visit       Other   HIV disease (HCC) - Primary    Roger Washington continues to have well controlled virus with good adherence and tolerance to Biktarvy.  Reviewed lab work and discussed plan of care, U equals U, and family planning. Check lab work. Continue current dose of Biktarvy. Plan for follow up in  3 months or sooner if needed with lab work on the same day.       Relevant Orders   COMPLETE METABOLIC PANEL WITH GFR   HIV-1 RNA quant-no reflex-bld   T-helper cell (CD4)- ([facility] clinic only)   Urine cytology ancillary only   Healthcare maintenance    Discussed importance of safe sexual practice and condom use. Condoms and STD testing offered.  Reviewed vaccinations - declined today Routine dental care up to date.       Nausea    Nausea of unclear origin although slightly improved. Consider further work up if symptoms do not improve.       Other Visit  Diagnoses     Screening for venereal disease       Relevant Orders   Urine cytology ancillary only        I am having [name] maintain his [place].   Follow-up: Return in about 3 months (around 11/23/2023), or if symptoms worsen or fail to improve. or sooner if needed.    Marcos Eke, MSN, FNP-C Nurse Practitioner [facility] for Infectious Disease [facility] Main number: [phone]

## 2023-08-25 NOTE — Patient Instructions (Addendum)
Nice to see you.  We will check your lab work today.  Continue to take your medication daily as prescribed.  Plan for follow up in 3 months or sooner if needed with lab work on the same day.  Have a great day and stay safe!  

## 2023-08-25 NOTE — Assessment & Plan Note (Signed)
Discussed importance of safe sexual practice and condom use. Condoms and STD testing offered.  Reviewed vaccinations - declined today Routine dental care up to date.

## 2023-08-25 NOTE — Assessment & Plan Note (Signed)
Mr. Bakalar continues to have well controlled virus with good adherence and tolerance to [employer].  Reviewed lab work and discussed plan of care, U equals U, and family planning. Check lab work. Continue current dose of Biktarvy. Plan for follow up in  3 months or sooner if needed with lab work on the same day.

## 2023-08-25 NOTE — Assessment & Plan Note (Signed)
Nausea of unclear origin although slightly improved. Consider further work up if symptoms do not improve.

## 2023-08-27 LAB — HIV-1 RNA QUANT-NO REFLEX-BLD
HIV 1 RNA Quant: NOT DETECTED {copies}/mL
HIV-1 RNA Quant, Log: NOT DETECTED {Log_copies}/mL

## 2023-08-27 LAB — COMPLETE METABOLIC PANEL WITH GFR
AG Ratio: 1.7 (calc) (ref 1.0–2.5)
ALT: 56 U/L — ABNORMAL HIGH (ref 9–46)
AST: 38 U/L (ref 10–40)
Albumin: 4.8 g/dL (ref 3.6–5.1)
Alkaline phosphatase (APISO): 122 U/L (ref 36–130)
BUN: 9 mg/dL (ref 7–25)
CO2: 28 mmol/L (ref 20–32)
Calcium: 9.5 mg/dL (ref 8.6–10.3)
Chloride: 101 mmol/L (ref 98–110)
Creat: 0.92 mg/dL (ref 0.60–1.24)
Globulin: 2.9 g/dL (ref 1.9–3.7)
Glucose, Bld: 90 mg/dL (ref 65–99)
Potassium: 3.3 mmol/L — ABNORMAL LOW (ref 3.5–5.3)
Sodium: 138 mmol/L (ref 135–146)
Total Bilirubin: 0.5 mg/dL (ref 0.2–1.2)
Total Protein: 7.7 g/dL (ref 6.1–8.1)
eGFR: 122 mL/min/{1.73_m2} (ref >=60)

## 2023-08-27 LAB — T-HELPER CELL (CD4) - (RCID CLINIC ONLY)
CD4 % Helper T Cell: 27 % — ABNORMAL LOW (ref 33–65)
CD4 T Cell Abs: 259 /uL — ABNORMAL LOW (ref 400–1790)

## 2023-08-27 LAB — URINE CYTOLOGY ANCILLARY ONLY
Chlamydia: NEGATIVE
Comment: NEGATIVE
Comment: NORMAL
Neisseria Gonorrhea: NEGATIVE

## 2023-09-08 ENCOUNTER — Other Ambulatory Visit: Payer: Self-pay

## 2023-09-12 ENCOUNTER — Other Ambulatory Visit: Payer: Self-pay | Admitting: Pharmacist

## 2023-09-12 NOTE — Progress Notes (Signed)
Specialty Pharmacy Ongoing Clinical Assessment Note  Roger Washington is a 22 y.o. male who is being followed by the specialty pharmacy service for RxSp HIV   Patient's specialty medication(s) reviewed today: Bictegravir-Emtricitab-Tenofov (Biktarvy)   Missed doses in the last 4 weeks: 1-2   Patient/Caregiver did not have any additional questions or concerns.   Therapeutic benefit summary: Patient is achieving benefit   Adverse events/side effects summary: No adverse events/side effects   Patient's therapy is appropriate to: Continue    Goals Addressed   None     Follow up:  3 months  Jennette Kettle Specialty Pharmacist

## 2023-09-15 ENCOUNTER — Other Ambulatory Visit: Payer: Self-pay

## 2023-09-15 ENCOUNTER — Other Ambulatory Visit (HOSPITAL_COMMUNITY): Payer: Self-pay

## 2023-09-15 NOTE — Progress Notes (Signed)
Specialty Pharmacy Refill Coordination Note  Roger Washington is a 22 y.o. male contacted today regarding refills of specialty medication(s) Bictegravir-Emtricitab-Tenofov Susanne Borders)   Patient requested Delivery   Delivery date: 09/18/23   Verified address: [address]   Medication will be filled on 09/16/23.

## 2023-09-16 ENCOUNTER — Other Ambulatory Visit: Payer: Self-pay

## 2023-10-02 ENCOUNTER — Other Ambulatory Visit (HOSPITAL_COMMUNITY): Payer: Self-pay

## 2023-10-08 ENCOUNTER — Other Ambulatory Visit: Payer: Self-pay

## 2023-10-08 NOTE — Progress Notes (Signed)
 Specialty Pharmacy Refill Coordination Note  Roger Washington is a 23 y.o. male contacted today regarding refills of specialty medication(s) Bictegravir-Emtricitab-Tenofov (Biktarvy )   Patient requested Delivery   Delivery date: 10/29/23   Verified address: [address]   Medication will be filled on 02.04.25.

## 2023-10-28 ENCOUNTER — Other Ambulatory Visit: Payer: Self-pay

## 2023-11-19 ENCOUNTER — Other Ambulatory Visit: Payer: Self-pay

## 2023-11-21 ENCOUNTER — Other Ambulatory Visit: Payer: Self-pay

## 2023-11-21 ENCOUNTER — Other Ambulatory Visit (HOSPITAL_COMMUNITY): Payer: Self-pay

## 2023-11-21 NOTE — Progress Notes (Signed)
 Specialty Pharmacy Refill Coordination Note  Roger Washington is a 23 y.o. male contacted today regarding refills of specialty medication(s) No data recorded  Patient requested (Patient-Rptd) Delivery   Delivery date: (Patient-Rptd) 12/03/23   Verified address: (Patient-Rptd) [address] [place], [place] [address]   Medication will be filled on 12/02/23.

## 2023-12-02 ENCOUNTER — Other Ambulatory Visit (HOSPITAL_COMMUNITY): Payer: Self-pay

## 2023-12-08 ENCOUNTER — Ambulatory Visit (INDEPENDENT_AMBULATORY_CARE_PROVIDER_SITE_OTHER): Payer: MEDICAID | Admitting: Family

## 2023-12-08 ENCOUNTER — Other Ambulatory Visit (HOSPITAL_COMMUNITY): Payer: Self-pay

## 2023-12-08 ENCOUNTER — Telehealth: Payer: Self-pay

## 2023-12-08 ENCOUNTER — Encounter: Payer: Self-pay | Admitting: Family

## 2023-12-08 ENCOUNTER — Other Ambulatory Visit: Payer: Self-pay

## 2023-12-08 VITALS — BP 106/69 | HR 74 | Temp 97.9°F | Ht 63.0 in | Wt 99.0 lb

## 2023-12-08 DIAGNOSIS — R636 Underweight: Secondary | ICD-10-CM | POA: Diagnosis not present

## 2023-12-08 DIAGNOSIS — Z681 Body mass index (BMI) 19 or less, adult: Secondary | ICD-10-CM

## 2023-12-08 DIAGNOSIS — B20 Human immunodeficiency virus [HIV] disease: Secondary | ICD-10-CM

## 2023-12-08 DIAGNOSIS — Z0000 Encounter for general adult medical examination without abnormal findings: Secondary | ICD-10-CM

## 2023-12-08 MED ORDER — BIKTARVY 50-200-25 MG PO TABS
1.0000 | ORAL_TABLET | Freq: Every day | ORAL | 5 refills | Status: DC
  Filled 2023-12-08 – 2024-01-13 (×2): qty 30, 30d supply, fill #0
  Filled 2024-02-05: qty 30, 30d supply, fill #1

## 2023-12-08 NOTE — Telephone Encounter (Signed)
 Patient in office today to see [name], Np. While getting labs drawn patient started to feel dizzy, skin was clammy and pale. Bp starting was 62/34 hr 65 o2 96 @ 3:35. Patient stated that he has not eaten anything for over 24 hrs. He was given soda, a sandwich, and crackers. At 3:30 bp 99/52, @ 3:43 93/63. At 3:50 bp 96/62 patient was alert not dizzy, skin was not clammy, pallor had improved. Patient was able to walk and stand and stated that he was feeling better. Per Np Marcos Eke patient was ok to go home.

## 2023-12-08 NOTE — Patient Instructions (Addendum)
 Nice to see you.  We will check your lab work today.  Continue to take your medication daily as prescribed.  Refills have been sent to the pharmacy.  Plan for follow up in 2 months or sooner if needed with lab work on the same day.  Have a great day and stay safe!

## 2023-12-08 NOTE — Assessment & Plan Note (Addendum)
 Roger Washington continues to have well-controlled virus with good adherence and tolerance to [employer].  Discussed importance of taking medication daily as prescribed and avoiding missing doses to reduce risk of disease progression and complications in the future.  Reviewed previous lab work and discussed plan of care and U equals U.  Social determinants of health reviewed with interventions started.  Check lab work.  Continue current dose of Biktarvy.  Plan for follow-up in 2 months or sooner if needed with lab work on the same day.  ADDENDUM: Events of blood draw reviewed and agreed with plan of care.

## 2023-12-08 NOTE — Progress Notes (Signed)
 Brief Narrative   Patient ID: Roger Washington, male    DOB: 24-May-2001, 23 y.o.   MRN: [id number]  Roger Washington is a 23 y/o AA male diagnosed with HIV-1 disease in May 2024 with unknown risk factor. Initial viral load of 315,000 with CD4 count 222. Enters care at [facility] stage 2. Genotype with no significant medication resistance mutations. [id number] and Quantiferon Gold negative. No history of opportunistic infection. Rapid start with Biktarvy.   Subjective:    Chief Complaint  Patient presents with   Follow-up   HIV Positive/AIDS    HPI:  Roger Washington is a 23 y.o. male with HIV disease last seen on 08/25/2023 with well-controlled virus and good adherence and tolerance to [employer].  Viral load was undetectable with CD4 count 259.  Kidney function and electrolytes within normal ranges.  ALT slightly elevated at 56.  STD testing negative.  Here today for follow-up.  Roger Washington has been doing okay since his last office visit and continues to take Biktarvy with no adverse side effects.  Has missed medication on average 1-2 times per week secondary to accessibility as his stepfather is not aware of his diagnosis.  He is also taking an immune system gummy/supplement.  Has concerns about weight loss with less than optimal oral intake.  Primary nutrition is noodles.  He is driving his own car now. Working at [employer].  Access to food is okay but does not always like what is being cooked for dinner. Housing is currently stable and staying with his mother. Concerned about 1 pound weight loss.  Condoms and site-specific STD testing offered.  Healthcare maintenance reviewed.   Denies fevers, chills, night sweats, headaches, changes in vision, neck pain/stiffness, nausea, diarrhea, vomiting, lesions or rashes.  Lab Results  Component Value Date   CD4TCELL 27 (L) 08/25/2023   CD4TABS 259 (L) 08/25/2023   Lab Results  Component Value Date   HIV1RNAQUANT Not Detected 08/25/2023   Wt Readings from Last 3  Encounters:  12/08/23 99 lb (44.9 kg)  08/25/23 100 lb (45.4 kg)  06/02/23 100 lb (45.4 kg)     No Known Allergies    Outpatient Medications Prior to Visit  Medication Sig Dispense Refill   bictegravir-emtricitabine-tenofovir AF (BIKTARVY) 50-200-25 MG TABS tablet Take 1 tablet by mouth daily. 30 tablet 5   No facility-administered medications prior to visit.     Past Medical History:  Diagnosis Date   Hemiparesis (HCC)    TBI (traumatic brain injury) (HCC)      History reviewed. No pertinent surgical history.    Review of Systems  Constitutional:  Negative for appetite change, chills, fatigue, fever and unexpected weight change.  Eyes:  Negative for visual disturbance.  Respiratory:  Negative for cough, chest tightness, shortness of breath and wheezing.   Cardiovascular:  Negative for chest pain and leg swelling.  Gastrointestinal:  Negative for abdominal pain, constipation, diarrhea, nausea and vomiting.  Genitourinary:  Negative for dysuria, flank pain, frequency, genital sores, hematuria and urgency.  Skin:  Negative for rash.  Allergic/Immunologic: Negative for immunocompromised state.  Neurological:  Negative for dizziness and headaches.      Objective:    BP 106/69   Pulse 74   Temp 97.9 F (36.6 C) (Oral)   Ht 5\' 3"  (1.6 m)   Wt 99 lb (44.9 kg)   SpO2 98%   BMI 17.54 kg/m  Nursing note and vital signs reviewed.  Physical Exam Constitutional:      General: He  is not in acute distress.    Appearance: He is well-developed and underweight.  Eyes:     Conjunctiva/sclera: Conjunctivae normal.  Cardiovascular:     Rate and Rhythm: Normal rate and regular rhythm.     Heart sounds: Normal heart sounds. No murmur heard.    No friction rub. No gallop.  Pulmonary:     Effort: Pulmonary effort is normal. No respiratory distress.     Breath sounds: Normal breath sounds. No wheezing or rales.  Chest:     Chest wall: No tenderness.  Abdominal:      General: Bowel sounds are normal.     Palpations: Abdomen is soft.     Tenderness: There is no abdominal tenderness.  Musculoskeletal:     Cervical back: Neck supple.  Lymphadenopathy:     Cervical: No cervical adenopathy.  Skin:    General: Skin is warm and dry.     Findings: No rash.  Neurological:     Mental Status: He is alert and oriented to person, place, and time.  Psychiatric:        Behavior: Behavior normal.        Thought Content: Thought content normal.        Judgment: Judgment normal.         12/08/2023    2:47 PM 08/25/2023    4:07 PM 06/02/2023    2:59 PM 04/21/2023    3:56 PM 03/14/2023    9:23 AM  Depression screen PHQ 2/9  Decreased Interest 0 0 0 0 0  Down, Depressed, Hopeless 0 0 0 0 0  PHQ - 2 Score 0 0 0 0 0       Assessment & Plan:    Patient Active Problem List   Diagnosis Date Noted   Underweight (BMI < 18.5) 12/08/2023   Nausea 08/25/2023   Healthcare maintenance 04/21/2023   HIV disease (HCC) 03/14/2023   Scoliosis of thoracolumbar spine 03/04/2017   Traumatic subdural hematoma, sequela (HCC) 03/04/2017   Anxiety and depression 03/04/2017   Spastic hemiplegia affecting nondominant side (HCC) 12/01/2013   Closed TBI (traumatic brain injury) (HCC) 12/01/2013   History of seizures 12/01/2013   Muscle spasticity 12/01/2013   Problems with learning 12/01/2013   Alteration of awareness 12/01/2013     Problem List Items Addressed This Visit       Other   HIV disease (HCC) - Primary   Roger Washington continues to have well-controlled virus with good adherence and tolerance to [employer].  Discussed importance of taking medication daily as prescribed and avoiding missing doses to reduce risk of disease progression and complications in the future.  Reviewed previous lab work and discussed plan of care and U equals U.  Social determinants of health reviewed with interventions started.  Check lab work.  Continue current dose of Biktarvy.  Plan for  follow-up in 2 months or sooner if needed with lab work on the same day.  ADDENDUM: Events of blood draw reviewed and agreed with plan of care.       Relevant Medications   bictegravir-emtricitabine-tenofovir AF (BIKTARVY) 50-200-25 MG TABS tablet   Other Relevant Orders   COMPLETE METABOLIC PANEL WITH GFR   HIV-1 RNA quant-no reflex-bld   T-helper cells (CD4) count (not at [facility])   Healthcare maintenance   Discussed importance of safe sexual practice and condom use. Condoms and site specific STD testing offered.  Vaccinations reviewed and declined after counseling. Due for routine dental care and will schedule independently.  Underweight (BMI < 18.5)   Roger Washington is underweight which appears to be related to less than adequate oral intake in both quality and quantity. Discussed importance of protein intake and varied nutritional choices. Food access appears to be adequate but cannot completely exclude food insecurity. Would benefit from Ensure and will work to obtain to supplement.        I am having Roger Washington maintain his Biktarvy.   Meds ordered this encounter  Medications   bictegravir-emtricitabine-tenofovir AF (BIKTARVY) 50-200-25 MG TABS tablet    Sig: Take 1 tablet by mouth daily.    Dispense:  30 tablet    Refill:  5    Supervising Provider:   Judyann Munson [phone]    Prescription Type::   Renewal     Follow-up: Return in about 2 months (around 02/07/2024). or sooner if needed.    Marcos Eke, MSN, FNP-C Nurse Practitioner [facility]ious Disease [facility] Main number: [phone]

## 2023-12-08 NOTE — Assessment & Plan Note (Signed)
 Discussed importance of safe sexual practice and condom use. Condoms and site specific STD testing offered.  Vaccinations reviewed and declined after counseling. Due for routine dental care and will schedule independently.

## 2023-12-08 NOTE — Assessment & Plan Note (Signed)
 [name] is underweight which appears to be related to less than adequate oral intake in both quality and quantity. Discussed importance of protein intake and varied nutritional choices. Food access appears to be adequate but cannot completely exclude food insecurity. Would benefit from Ensure and will work to obtain to supplement.

## 2023-12-09 LAB — COMPLETE METABOLIC PANEL WITH GFR
AG Ratio: 1.8 (calc) (ref 1.0–2.5)
ALT: 21 U/L (ref 9–46)
AST: 21 U/L (ref 10–40)
Albumin: 4.8 g/dL (ref 3.6–5.1)
Alkaline phosphatase (APISO): 93 U/L (ref 36–130)
BUN: 15 mg/dL (ref 7–25)
CO2: 28 mmol/L (ref 20–32)
Calcium: 9.8 mg/dL (ref 8.6–10.3)
Chloride: 101 mmol/L (ref 98–110)
Creat: 0.84 mg/dL (ref 0.60–1.24)
Globulin: 2.7 g/dL (ref 1.9–3.7)
Glucose, Bld: 78 mg/dL (ref 65–99)
Potassium: 3.5 mmol/L (ref 3.5–5.3)
Sodium: 137 mmol/L (ref 135–146)
Total Bilirubin: 0.6 mg/dL (ref 0.2–1.2)
Total Protein: 7.5 g/dL (ref 6.1–8.1)
eGFR: 127 mL/min/{1.73_m2} (ref >=60)

## 2023-12-10 LAB — COMPREHENSIVE METABOLIC PANEL
AG Ratio: 1.8 (calc) (ref 1.0–2.5)
ALT: 21 U/L (ref 9–46)
AST: 21 U/L (ref 10–40)
Albumin: 4.8 g/dL (ref 3.6–5.1)
Alkaline phosphatase (APISO): 93 U/L (ref 36–130)
BUN: 15 mg/dL (ref 7–25)
CO2: 28 mmol/L (ref 20–32)
Calcium: 9.8 mg/dL (ref 8.6–10.3)
Chloride: 101 mmol/L (ref 98–110)
Creat: 0.84 mg/dL (ref 0.60–1.24)
Globulin: 2.7 g/dL (ref 1.9–3.7)
Glucose, Bld: 78 mg/dL (ref 65–99)
Potassium: 3.5 mmol/L (ref 3.5–5.3)
Sodium: 137 mmol/L (ref 135–146)
Total Bilirubin: 0.6 mg/dL (ref 0.2–1.2)
Total Protein: 7.5 g/dL (ref 6.1–8.1)
eGFR: 127 mL/min/{1.73_m2} (ref >=60)

## 2023-12-10 LAB — T-HELPER CELLS (CD4) COUNT (NOT AT ARMC)
Absolute CD4: 640 {cells}/uL (ref 490–1740)
CD4 T Helper %: 42 % (ref 30–61)
Total lymphocyte count: 1526 {cells}/uL (ref 850–3900)

## 2023-12-10 LAB — HIV-1 RNA QUANT-NO REFLEX-BLD
HIV 1 RNA Quant: 36 {copies}/mL — ABNORMAL HIGH
HIV-1 RNA Quant, Log: 1.56 {Log_copies}/mL — ABNORMAL HIGH

## 2023-12-22 ENCOUNTER — Other Ambulatory Visit (HOSPITAL_COMMUNITY): Payer: Self-pay

## 2024-01-02 ENCOUNTER — Other Ambulatory Visit: Payer: Self-pay

## 2024-01-06 ENCOUNTER — Other Ambulatory Visit (HOSPITAL_COMMUNITY): Payer: Self-pay

## 2024-01-07 ENCOUNTER — Other Ambulatory Visit: Payer: Self-pay | Admitting: Pharmacist

## 2024-01-07 NOTE — Progress Notes (Signed)
 Specialty Pharmacy Ongoing Clinical Assessment Note  Roger Washington is a 23 y.o. male who is being followed by the specialty pharmacy service for RxSp HIV   Patient's specialty medication(s) reviewed today: Bictegravir-Emtricitab-Tenofov (Biktarvy)   Missed doses in the last 4 weeks: 1   Patient/Caregiver did not have any additional questions or concerns.   Therapeutic benefit summary: Patient is achieving benefit   Adverse events/side effects summary: No adverse events/side effects   Patient's therapy is appropriate to: Continue    Goals Addressed             This Visit's Progress    Achieve Undetectable HIV Viral Load < 20   Improving    Patient is not on track and worsening. Patient will work on increased adherence      Increase CD4 count until steady state   On track    Patient is on track . Patient will work on increased adherence      Maintain optimal adherence to therapy   On track    Patient is on track . Patient will work on increased adherence         Follow up:  3 months  Sonya Duster Specialty Pharmacist

## 2024-01-13 ENCOUNTER — Other Ambulatory Visit (HOSPITAL_COMMUNITY): Payer: Self-pay

## 2024-01-13 ENCOUNTER — Other Ambulatory Visit: Payer: Self-pay

## 2024-01-13 ENCOUNTER — Ambulatory Visit: Payer: MEDICAID | Admitting: Physician Assistant

## 2024-01-13 NOTE — Progress Notes (Deleted)
 New patient visit   Patient: Roger Washington   DOB: Nov 02, 2000   22 y.o. Male  MRN: [id number] Visit Date: 01/13/2024  Today's healthcare provider: Trenton Frock, PA-C   No chief complaint on file.  Subjective    Roger Washington is a 23 y.o. male who presents today as a new patient to establish care.   ***  Past Medical History:  Diagnosis Date   Hemiparesis (HCC)    TBI (traumatic brain injury) (HCC)    No past surgical history on file. Family Status  Relation Name Status   Mother  Alive   Father  Alive   Sister  Alive       Pt has 1 Paternal 1/2 sister   Brother  Alive       Older   MGM  Alive   MGF  Alive   PGM  Alive   PGF  Alive   Cousin Paternal 1st Alive   Other MGGM Alive  No partnership data on file   Family History  Problem Relation Age of Onset   Migraines Maternal Grandmother    ADD / ADHD Cousin    Bipolar disorder Other    Social History   Socioeconomic History   Marital status: Single    Spouse name: Single   Number of children: 0   Years of education: 12   Highest education level: Not on file  Occupational History   Not on file  Tobacco Use   Smoking status: Never    Passive exposure: Never   Smokeless tobacco: Never  Vaping Use   Vaping status: Never Used  Substance and Sexual Activity   Alcohol use: No   Drug use: No   Sexual activity: Not on file  Other Topics Concern   Not on file  Social History Narrative   He lives with his mom. He has one brother.   He enjoys video games, football, basketball.   Working detail cars.    Social Drivers of Corporate investment banker Strain: Not on file  Food Insecurity: Not on file  Transportation Needs: Not on file  Physical Activity: Not on file  Stress: Not on file  Social Connections: Not on file   Outpatient Medications Prior to Visit  Medication Sig   bictegravir-emtricitabine -tenofovir  AF (BIKTARVY ) 50-200-25 MG TABS tablet Take 1 tablet by mouth daily.   No  facility-administered medications prior to visit.   No Known Allergies  Immunization History  Administered Date(s) Administered   DTaP 11/24/2002, 01/28/2003, 03/30/2003, 12/10/2004, 05/18/2007   HIB (PRP-T) 11/24/2002, 01/28/2003, 03/30/2003, 10/31/2003   HPV 9-valent 09/20/2015, 05/03/2016   Hepatitis A, Ped/Adol-2 Dose 05/15/2006, 05/18/2007   Hepatitis B, PED/ADOLESCENT 11/24/2002, 01/28/2003, 03/30/2003   IPV 11/24/2002, 01/28/2003, 03/30/2003, 05/18/2007   Influenza, Seasonal, Injecte, Preservative Fre 08/03/2008   MMR 10/25/2004, 05/18/2007   Meningococcal Conjugate 12/10/2013   Pneumococcal Polysaccharide-23 11/24/2002, 01/28/2003, 03/30/2003, 05/07/2005   Tdap 12/10/2013   Varicella 12/10/2004, 05/18/2007    Health Maintenance  Topic Date Due   Pneumococcal Vaccine 21-53 Years old (2 of 3 - PCV) 05/07/2006   COVID-19 Vaccine (1) Never done   HPV VACCINES (3 - Risk male 3-dose series) 09/02/2016   Meningococcal B Vaccine (1 of 2 - Standard) Never done   DTaP/Tdap/Td (7 - Td or Tdap) 12/11/2023   INFLUENZA VACCINE  04/23/2024   Hepatitis C Screening  Completed   HIV Screening  Completed    No care team member to display  Review of Systems  {  Insert previous labs (optional):23779} {See past labs  Heme  Chem  Endocrine  Serology  Results Review (optional):1}   Objective    There were no vitals taken for this visit. {Insert last BP/Wt (optional):23777}{See vitals history (optional):1}   Physical Exam ***  Depression Screen    12/08/2023    2:47 PM 08/25/2023    4:07 PM 06/02/2023    2:59 PM 04/21/2023    3:56 PM  PHQ 2/9 Scores  PHQ - 2 Score 0 0 0 0   No results found for any visits on 01/13/24.  Assessment & Plan     There are no diagnoses linked to this encounter.   No follow-ups on file.      Trenton Frock, PA-C  [facility] at [facility] [phone] (phone) [phone] (fax)  [facility]

## 2024-01-13 NOTE — Progress Notes (Signed)
 Specialty Pharmacy Refill Coordination Note  Roger Washington is a 23 y.o. male contacted today regarding refills of specialty medication(s) Bictegravir-Emtricitab-Tenofov (Biktarvy )   Patient requested Delivery   Delivery date: 01/15/24   Verified address: [address] [place] [address]   Medication will be filled on 01/14/24.

## 2024-01-14 ENCOUNTER — Other Ambulatory Visit: Payer: Self-pay

## 2024-01-29 ENCOUNTER — Encounter (HOSPITAL_COMMUNITY): Payer: Self-pay

## 2024-01-29 ENCOUNTER — Other Ambulatory Visit: Payer: Self-pay

## 2024-02-05 ENCOUNTER — Other Ambulatory Visit: Payer: Self-pay

## 2024-02-05 ENCOUNTER — Ambulatory Visit: Payer: MEDICAID | Admitting: Physician Assistant

## 2024-02-05 NOTE — Progress Notes (Deleted)
 New patient visit   Patient: Roger Washington   DOB: Feb 14, 2001   22 y.o. Male  MRN: [id number] Visit Date: 02/05/2024  Today's healthcare provider: Trenton Frock, PA-C   No chief complaint on file.  Subjective    Roger Washington is a 23 y.o. male who presents today as a new patient to establish care.   ***  Past Medical History:  Diagnosis Date   Hemiparesis (HCC)    TBI (traumatic brain injury) (HCC)    No past surgical history on file. Family Status  Relation Name Status   Mother  Alive   Father  Alive   Sister  Alive       Pt has 1 Paternal 1/2 sister   Brother  Alive       Older   MGM  Alive   MGF  Alive   PGM  Alive   PGF  Alive   Cousin Paternal 1st Alive   Other MGGM Alive  No partnership data on file   Family History  Problem Relation Age of Onset   Migraines Maternal Grandmother    ADD / ADHD Cousin    Bipolar disorder Other    Social History   Socioeconomic History   Marital status: Single    Spouse name: Single   Number of children: 0   Years of education: 12   Highest education level: Not on file  Occupational History   Not on file  Tobacco Use   Smoking status: Never    Passive exposure: Never   Smokeless tobacco: Never  Vaping Use   Vaping status: Never Used  Substance and Sexual Activity   Alcohol use: No   Drug use: No   Sexual activity: Not on file  Other Topics Concern   Not on file  Social History Narrative   He lives with his mom. He has one brother.   He enjoys video games, football, basketball.   Working detail cars.    Social Drivers of Corporate investment banker Strain: Not on file  Food Insecurity: Not on file  Transportation Needs: Not on file  Physical Activity: Not on file  Stress: Not on file  Social Connections: Not on file   Outpatient Medications Prior to Visit  Medication Sig   bictegravir-emtricitabine -tenofovir  AF (BIKTARVY ) 50-200-25 MG TABS tablet Take 1 tablet by mouth daily.   No  facility-administered medications prior to visit.   No Known Allergies  Immunization History  Administered Date(s) Administered   DTaP 11/24/2002, 01/28/2003, 03/30/2003, 12/10/2004, 05/18/2007   HIB (PRP-T) 11/24/2002, 01/28/2003, 03/30/2003, 10/31/2003   HPV 9-valent 09/20/2015, 05/03/2016   Hepatitis A, Ped/Adol-2 Dose 05/15/2006, 05/18/2007   Hepatitis B, PED/ADOLESCENT 11/24/2002, 01/28/2003, 03/30/2003   IPV 11/24/2002, 01/28/2003, 03/30/2003, 05/18/2007   Influenza, Seasonal, Injecte, Preservative Fre 08/03/2008   MMR 10/25/2004, 05/18/2007   Meningococcal Conjugate 12/10/2013   Pneumococcal Polysaccharide-23 11/24/2002, 01/28/2003, 03/30/2003, 05/07/2005   Tdap 12/10/2013   Varicella 12/10/2004, 05/18/2007    Health Maintenance  Topic Date Due   Pneumococcal Vaccine 65-66 Years old (2 of 3 - PCV) 05/07/2006   COVID-19 Vaccine (1) Never done   HPV VACCINES (3 - Risk male 3-dose series) 09/02/2016   Meningococcal B Vaccine (1 of 2 - Standard) Never done   DTaP/Tdap/Td (7 - Td or Tdap) 12/11/2023   INFLUENZA VACCINE  04/23/2024   Hepatitis C Screening  Completed   HIV Screening  Completed    No care team member to display  Review of Systems  {  Insert previous labs (optional):23779} {See past labs  Heme  Chem  Endocrine  Serology  Results Review (optional):1}   Objective    There were no vitals taken for this visit. {Insert last BP/Wt (optional):23777}{See vitals history (optional):1}   Physical Exam ***  Depression Screen    12/08/2023    2:47 PM 08/25/2023    4:07 PM 06/02/2023    2:59 PM 04/21/2023    3:56 PM  PHQ 2/9 Scores  PHQ - 2 Score 0 0 0 0   No results found for any visits on 02/05/24.  Assessment & Plan     There are no diagnoses linked to this encounter.   No follow-ups on file.      Trenton Frock, PA-C  [facility] at [facility] [phone] (phone) [phone] (fax)  [facility]

## 2024-02-05 NOTE — Progress Notes (Signed)
 Specialty Pharmacy Refill Coordination Note  Roger Washington is a 23 y.o. male contacted today regarding refills of specialty medication(s) Bictegravir-Emtricitab-Tenofov (Biktarvy )   Patient requested (Patient-Rptd) Delivery   Delivery date: 02/10/24   [address] [place], [place] [address]   Medication will be filled on 02/09/24.

## 2024-02-09 ENCOUNTER — Other Ambulatory Visit: Payer: Self-pay

## 2024-02-09 ENCOUNTER — Encounter: Payer: Self-pay | Admitting: Family

## 2024-02-09 ENCOUNTER — Ambulatory Visit (INDEPENDENT_AMBULATORY_CARE_PROVIDER_SITE_OTHER): Payer: MEDICAID | Admitting: Family

## 2024-02-09 VITALS — BP 102/66 | HR 79 | Temp 98.1°F | Ht 63.0 in | Wt 99.0 lb

## 2024-02-09 DIAGNOSIS — B20 Human immunodeficiency virus [HIV] disease: Secondary | ICD-10-CM

## 2024-02-09 DIAGNOSIS — Z0000 Encounter for general adult medical examination without abnormal findings: Secondary | ICD-10-CM

## 2024-02-09 NOTE — Patient Instructions (Signed)
 Nice to see you.  We will check your lab work today.  Trial the Dovato daily.   Let me know if you want to continue with Dovato or go back to Biktarvy .   Plan for follow up in 4 months or sooner if needed with lab work on the same day.  Have a great day and stay safe!
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
## 2024-02-09 NOTE — Assessment & Plan Note (Addendum)
 Roger Washington continues to have well-controlled virus with good adherence and tolerance to Biktarvy .  Reviewed previous lab work and discussed plan of care and U equals U.  Unlikely Biktarvy  causing his drowsiness as it does not occur frequently.  The only way to determine if it is Biktarvy  is to change medications and following discussion we will trial Dovato for 2 weeks to see if symptoms occur with sample provided.  He will notify clinic whether he would like to change to Dovato or continue with Biktarvy .  Check lab work.  Social determinants of health reviewed with no interventions indicated.  Plan for follow-up in 4 months or sooner if needed with lab work on the same day.

## 2024-02-09 NOTE — Assessment & Plan Note (Signed)
 Discussed importance of safe sexual practice and condom use. Condoms and site specific STD testing offered.  Vaccinations reviewed and following counseling declined Routine dental care up-to-date.

## 2024-02-11 LAB — HIV-1 RNA QUANT-NO REFLEX-BLD
HIV 1 RNA Quant: 12400 {copies}/mL — ABNORMAL HIGH
HIV-1 RNA Quant, Log: 4.09 {Log_copies}/mL — ABNORMAL HIGH

## 2024-02-11 LAB — T-HELPER CELLS (CD4) COUNT (NOT AT ARMC)
Absolute CD4: 366 {cells}/uL — ABNORMAL LOW (ref 490–1740)
CD4 T Helper %: 29 % — ABNORMAL LOW (ref 30–61)
Total lymphocyte count: 1253 {cells}/uL (ref 850–3900)

## 2024-02-20 ENCOUNTER — Other Ambulatory Visit: Payer: Self-pay | Admitting: Pharmacist

## 2024-02-20 DIAGNOSIS — B20 Human immunodeficiency virus [HIV] disease: Secondary | ICD-10-CM

## 2024-02-20 MED ORDER — DOVATO 50-300 MG PO TABS
1.0000 | ORAL_TABLET | Freq: Every day | ORAL | Status: AC
Start: 2024-02-09 — End: 2024-02-23

## 2024-02-20 NOTE — Progress Notes (Signed)
 Medication Samples have been provided to the patient.  Drug name: Dovato         Strength: 50/300 mg         Qty: 14  Tablets (1 bottles) LOT: [id number]   Exp.Date: 7/26  Samples requested by Marlan Silva, NP.  Dosing instructions: Take one tablet by mouth once daily  The patient has been instructed regarding the correct time, dose, and frequency of taking this medication, including desired effects and most common side effects.   Nicklas Barns, PharmD, CPP, BCIDP, AAHIVP Clinical Pharmacist Practitioner Infectious Diseases Clinical Pharmacist [facility] for Infectious Disease

## 2024-03-03 ENCOUNTER — Other Ambulatory Visit: Payer: Self-pay

## 2024-03-03 ENCOUNTER — Other Ambulatory Visit (HOSPITAL_COMMUNITY): Payer: Self-pay

## 2024-03-03 ENCOUNTER — Encounter: Payer: Self-pay | Admitting: Family

## 2024-03-03 ENCOUNTER — Ambulatory Visit (INDEPENDENT_AMBULATORY_CARE_PROVIDER_SITE_OTHER): Payer: MEDICAID | Admitting: Family

## 2024-03-03 VITALS — BP 109/67 | HR 82 | Temp 98.0°F | Ht 63.0 in | Wt 99.0 lb

## 2024-03-03 DIAGNOSIS — R21 Rash and other nonspecific skin eruption: Secondary | ICD-10-CM | POA: Diagnosis not present

## 2024-03-03 DIAGNOSIS — B20 Human immunodeficiency virus [HIV] disease: Secondary | ICD-10-CM

## 2024-03-03 MED ORDER — DOVATO 50-300 MG PO TABS
1.0000 | ORAL_TABLET | Freq: Every day | ORAL | 5 refills | Status: AC
  Filled 2024-03-03: qty 30, 30d supply, fill #0
  Filled 2024-03-29 – 2024-03-31 (×2): qty 30, 30d supply, fill #1
  Filled 2024-04-27: qty 30, 30d supply, fill #2
  Filled 2024-05-26: qty 30, 30d supply, fill #3

## 2024-03-03 NOTE — Progress Notes (Signed)
 Specialty Pharmacy Refill Coordination Note  Roger Washington is a 24 y.o. male contacted today regarding refills of specialty medication(s) Dolutegravir-lamiVUDine (Dovato )   Patient requested Delivery   Delivery date: 03/05/24   Verified address: [address] [place] [address]   Medication will be filled on 03/04/24.

## 2024-03-03 NOTE — Patient Instructions (Addendum)
 Nice to see you.  We will check your lab work today.  Continue to take your medication daily as prescribed.  Refills have been sent to the pharmacy.  Plan for follow up in 3 months or sooner if needed with lab work on the same day.  Have a great day and stay safe!

## 2024-03-03 NOTE — Assessment & Plan Note (Signed)
 Wilson describes a penile rash that primarily occurs following showering in hot water which is improved following getting out of the shower.  No current symptoms.  Encouraged to take picture when rashes appearing.  Unclear at this point what may be causing this.  No penile discharge or concern for STD.

## 2024-03-03 NOTE — Assessment & Plan Note (Signed)
 Denorris has improved here and good tolerance to Dovato .  Discussed importance of taking medication daily to prevent disease progression and complications in the future.  Check viral load.  Continue current dose of Dovato .  Plan for follow-up in 3 months or sooner if needed.
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
## 2024-03-04 ENCOUNTER — Other Ambulatory Visit: Payer: Self-pay

## 2024-03-05 ENCOUNTER — Ambulatory Visit: Payer: Self-pay | Admitting: Family

## 2024-03-08 ENCOUNTER — Other Ambulatory Visit: Payer: Self-pay | Admitting: Pharmacist

## 2024-03-08 DIAGNOSIS — B20 Human immunodeficiency virus [HIV] disease: Secondary | ICD-10-CM

## 2024-03-08 MED ORDER — DOVATO 50-300 MG PO TABS
1.0000 | ORAL_TABLET | Freq: Every day | ORAL | Status: AC
Start: 2024-03-03 — End: 2024-03-17

## 2024-03-08 NOTE — Progress Notes (Signed)
 Medication Samples have been provided to the patient.  Drug name: Dovato        Strength: 50/300 mg         Qty: 1 box (14 tablets)   LOT: [id number]  Exp.Date: 04/22/25     Samples requested by Gwenevere Lent.  Dosing instructions: Take one tablet by mouth once daily  The patient has been instructed regarding the correct time, dose, and frequency of taking this medication, including desired effects and most common side effects.   Shanedra Lave L. Margart Shears, PharmD, BCIDP, AAHIVP, CPP Clinical Pharmacist Practitioner Infectious Diseases Clinical Pharmacist [facility] 09/04/2020, 10:07 AM

## 2024-03-24 ENCOUNTER — Other Ambulatory Visit: Payer: Self-pay

## 2024-03-29 ENCOUNTER — Other Ambulatory Visit (HOSPITAL_COMMUNITY): Payer: Self-pay

## 2024-03-31 ENCOUNTER — Other Ambulatory Visit: Payer: Self-pay

## 2024-03-31 ENCOUNTER — Other Ambulatory Visit: Payer: Self-pay | Admitting: Pharmacy Technician

## 2024-03-31 NOTE — Progress Notes (Signed)
 Clinical Intervention Note  Clinical Intervention Notes: Patient disclosed that his adherence level is variable at best. He knows he has missed doses but cannot be sure how many recently. Discussed with patient the importance of adherence especially since he is feeling better on Dovato  than previously on Biktarvy  and his VL had declined significantly in June. Advised that he should do his very best to remain adherent and counseled on possible future drug resistance and increase in VL again if he does not. Patient will do his best.   Clinical Intervention Outcomes: Improved therapy adherence   Norma Ignasiak M Brydon Spahr Specialty Pharmacist

## 2024-03-31 NOTE — Progress Notes (Signed)
 Specialty Pharmacy Refill Coordination Note  Roger Washington is a 24 y.o. male contacted today regarding refills of specialty medication(s) Dolutegravir -lamiVUDine  (Dovato )   Patient requested Delivery   Delivery date: 04/02/24   Verified address: [address]  [place]   Medication will be filled on 04/01/24.

## 2024-03-31 NOTE — Progress Notes (Signed)
 Specialty Pharmacy Ongoing Clinical Assessment Note  Roger Washington is a 23 y.o. male who is being followed by the specialty pharmacy service for RxSp HIV   Patient's specialty medication(s) reviewed today: Dolutegravir -lamiVUDine  (Dovato )   Missed doses in the last 4 weeks: More than 5   Patient/Caregiver did not have any additional questions or concerns.   Therapeutic benefit summary: Patient is achieving benefit   Adverse events/side effects summary: No adverse events/side effects   Patient's therapy is appropriate to: Continue    Goals Addressed             This Visit's Progress    Achieve Undetectable HIV Viral Load < 20   Improving    Patient is not on track and worsening. Patient will work on increased adherence      Increase CD4 count until steady state   Worsening    Patient is on track . Patient will work on increased adherence      Maintain optimal adherence to therapy   No change    Patient is on track . Patient will work on increased adherence         Follow up: 3 months  Lemar Bakos M Christ Fullenwider Specialty Pharmacist

## 2024-04-13 ENCOUNTER — Encounter (HOSPITAL_COMMUNITY): Payer: Self-pay

## 2024-04-13 ENCOUNTER — Ambulatory Visit (HOSPITAL_COMMUNITY)
Admission: EM | Admit: 2024-04-13 | Discharge: 2024-04-13 | Disposition: A | Discharge status: Home / Self Care | Payer: MEDICAID | Attending: Family Medicine | Admitting: Family Medicine

## 2024-04-13 DIAGNOSIS — R519 Headache, unspecified: Secondary | ICD-10-CM | POA: Diagnosis not present

## 2024-04-13 DIAGNOSIS — J0120 Acute ethmoidal sinusitis, unspecified: Secondary | ICD-10-CM | POA: Diagnosis not present

## 2024-04-13 MED ORDER — AMOXICILLIN-POT CLAVULANATE 875-125 MG PO TABS: 1.0000 | ORAL_TABLET | Freq: Two times a day (BID) | ORAL | 0 refills | Status: AC

## 2024-04-13 NOTE — ED Triage Notes (Addendum)
 Pt states bee sting to right elbow.  States after the sting he developed a headache. States he was seen in ED 3 days ago for headaches.

## 2024-04-13 NOTE — Discharge Instructions (Addendum)
 Based on the CT scan  From 04/09/2024 [facility] - EMERGENCY DEPARTMENT It is recommended that you have an outpatient MRI as we have discussed this evening.  Talk about this at your new primary care provider appointment.

## 2024-04-14 NOTE — ED Provider Notes (Signed)
 [facility] CARE CENTER   [phone] 04/13/24 Arrival Time: 1741  ASSESSMENT & PLAN:  1. Acute nonintractable headache, unspecified headache type   2. Acute non-recurrent ethmoidal sinusitis    Discussed results of CT done at [facility] recently. Outpt MRI recommended. Has appt 06/21/24 with Internal Medicine. ED if any acute HA/worsening. Will tx for ethmoid sinusitis.  Meds ordered this encounter  Medications   amoxicillin -clavulanate (AUGMENTIN ) 875-125 MG tablet    Sig: Take 1 tablet by mouth every 12 (twelve) hours.    Dispense:  20 tablet    Refill:  0   Normal neurological exam. Without fever, focal neuro logical deficits, nuchal rigidity, or change in vision.   Recommend:  Follow-up Information     [place] [facility].   Specialty: Emergency Medicine Why: If symptoms worsen in any way. Contact information: [address] [place]  [address] [phone]                 Reviewed expectations re: course of current medical issues. Questions answered. Outlined signs and symptoms indicating need for more acute intervention. Patient verbalized understanding. After Visit Summary given.   SUBJECTIVE: History from: Patient Patient is able to give a clear and coherent history.  Roger Washington is a 23 y.o. male who presents with complaint of a HA headache. Pt states bee sting to right elbow. States after the sting he developed a headache. States he was seen in ED 3 days ago for headaches. Had CT done at [facility] but LWBS. Called by [facility] to inform him that outpt MRI should be obtained. Denies HA while here. Denies fever/n/v/visual changes. Normal ambulation. Denies extremity sensation changes or weakness. No tx PTA.   OBJECTIVE:  Vitals:   04/13/24 1821  BP: 102/65  Pulse: 85  Resp: 16  Temp: 98.1 F (36.7 C)  TempSrc: Oral  SpO2: 98%    General appearance: alert; NAD HENT: normocephalic;  atraumatic Eyes: PERRLA; EOMI; conjunctivae normal Neck: supple with FROM Lungs: clear to auscultation bilaterally; unlabored respirations Heart: regular rate and rhythm Extremities: no edema; symmetrical with no gross deformities Skin: warm and dry; no sign of bee sting on R elbow Neurologic: alert; speech is fluent and clear without dysarthria or aphasia; CN 2-12 grossly intact; no facial droop; normal gait; normal symmetric reflexes; normal extremity strength and sensation throughout; bilateral upper and lower extremity sensation is grossly intact with 5/5 symmetric strength; normal grip strength Psychological: alert and cooperative; normal mood and affect   No Known Allergies  Past Medical History:  Diagnosis Date   Hemiparesis (HCC)    TBI (traumatic brain injury) (HCC)    Social History   Socioeconomic History   Marital status: Single    Spouse name: Single   Number of children: 0   Years of education: 12   Highest education level: Not on file  Occupational History   Not on file  Tobacco Use   Smoking status: Never    Passive exposure: Never   Smokeless tobacco: Never  Vaping Use   Vaping status: Never Used  Substance and Sexual Activity   Alcohol use: No   Drug use: No   Sexual activity: Not on file    Comment: accepted condoms  Other Topics Concern   Not on file  Social History Narrative   He lives with his mom. He has one brother.   He enjoys video games, football, basketball.   Working detail cars.    Social Drivers  of Health   Financial Resource Strain: Not on file  Food Insecurity: Not on file  Transportation Needs: Not on file  Physical Activity: Not on file  Stress: Not on file  Social Connections: Not on file  Intimate Partner Violence: Not on file   Family History  Problem Relation Age of Onset   Migraines Maternal Grandmother    ADD / ADHD Cousin    Bipolar disorder Other    History reviewed. No pertinent surgical history.    Rolinda Rogue, MD 04/14/24 [phone]

## 2024-04-20 ENCOUNTER — Other Ambulatory Visit (HOSPITAL_COMMUNITY): Payer: Self-pay

## 2024-04-21 ENCOUNTER — Other Ambulatory Visit (HOSPITAL_COMMUNITY): Payer: Self-pay

## 2024-04-27 ENCOUNTER — Other Ambulatory Visit: Payer: Self-pay

## 2024-04-27 NOTE — Progress Notes (Signed)
 Specialty Pharmacy Refill Coordination Note  Roger Washington is a 23 y.o. male contacted today regarding refills of specialty medication(s) Dolutegravir -lamiVUDine  (Dovato )   Patient requested Delivery   Delivery date: 04/29/24   Verified address: [address]  [place]   Medication will be filled on 04/28/24.

## 2024-05-12 ENCOUNTER — Ambulatory Visit: Payer: MEDICAID | Admitting: Internal Medicine

## 2024-05-21 ENCOUNTER — Other Ambulatory Visit (HOSPITAL_COMMUNITY): Payer: Self-pay

## 2024-05-25 ENCOUNTER — Telehealth: Payer: Self-pay

## 2024-05-25 ENCOUNTER — Ambulatory Visit: Payer: MEDICAID | Admitting: Family

## 2024-05-25 NOTE — Telephone Encounter (Signed)
 Called patient regarding missed appt. States that he was called into work today and will need to call back to reschedule. Lorenda CHRISTELLA Code, RMA

## 2024-05-26 ENCOUNTER — Other Ambulatory Visit (HOSPITAL_COMMUNITY): Payer: Self-pay

## 2024-05-28 ENCOUNTER — Other Ambulatory Visit: Payer: Self-pay

## 2024-06-14 ENCOUNTER — Other Ambulatory Visit: Payer: Self-pay

## 2024-06-16 ENCOUNTER — Other Ambulatory Visit: Payer: Self-pay

## 2024-06-21 ENCOUNTER — Ambulatory Visit: Payer: MEDICAID | Admitting: Nurse Practitioner

## 2024-06-29 NOTE — Progress Notes (Unsigned)
 HPI: Roger Washington is a 23 y.o. male who presents to the [facility] pharmacy clinic for HIV follow-up.  Referring ID Provider: Cathlyn July, NP  Patient Active Problem List   Diagnosis Date Noted   Penile rash 03/03/2024   Underweight (BMI < 18.5) 12/08/2023   Nausea 08/25/2023   Healthcare maintenance 04/21/2023   HIV disease (HCC) 03/14/2023   Scoliosis of thoracolumbar spine 03/04/2017   Traumatic subdural hematoma, sequela 03/04/2017   Anxiety and depression 03/04/2017   Spastic hemiplegia affecting nondominant side (HCC) 12/01/2013   Closed TBI (traumatic brain injury) (HCC) 12/01/2013   History of seizures 12/01/2013   Muscle spasticity 12/01/2013   Problems with learning 12/01/2013   Alteration of awareness 12/01/2013    Patient's Medications  New Prescriptions   No medications on file  Previous Medications   AMOXICILLIN -CLAVULANATE (AUGMENTIN ) 875-125 MG TABLET    Take 1 tablet by mouth every 12 (twelve) hours.   DOLUTEGRAVIR -LAMIVUDINE  (DOVATO ) 50-300 MG TABLET    Take 1 tablet by mouth daily.  Modified Medications   No medications on file  Discontinued Medications   No medications on file    Labs: Lab Results  Component Value Date   HIV1RNAQUANT 4,990 (H) 03/03/2024   HIV1RNAQUANT 12,400 (H) 02/09/2024   HIV1RNAQUANT 36 (H) 12/08/2023   CD4TABS 259 (L) 08/25/2023   CD4TABS 370 (L) 06/30/2023   CD4TABS 341 (L) 06/02/2023    RPR and STI Lab Results  Component Value Date   LABRPR NON-REACTIVE 06/02/2023   LABRPR NON-REACTIVE 03/14/2023    STI Results GC CT  08/25/2023  4:08 PM Negative  Negative   06/02/2023  3:18 PM Negative  Negative     Hepatitis B Lab Results  Component Value Date   HEPBSAB NON-REACTIVE 03/14/2023   HEPBSAG NON-REACTIVE 03/14/2023   HEPBCAB NON-REACTIVE 03/14/2023   Hepatitis C Lab Results  Component Value Date   HEPCAB NON-REACTIVE 03/14/2023   Hepatitis A Lab Results  Component Value Date   HAV REACTIVE (A) 03/14/2023    Lipids: Lab Results  Component Value Date   CHOL 112 03/14/2023   TRIG 75 03/14/2023   HDL 41 03/14/2023   CHOLHDL 2.7 03/14/2023   LDLCALC 55 03/14/2023    Current HIV Regimen: Dovato   Assessment: Roger Washington is here today for HIV follow up. He last saw Cathlyn in June where he stated that he was tolerating his Dovato  well without any issues. He previously had trouble swallowing and tolerating Biktarvy  and was switched to Dovato  for this reason. He stated at his June appointment that he had been taking his ART every day but HIV RNA at that visit was 4,990. Dispense records show that he filled his Dovato  in June, July, and August but nothing since 04/28/24. He fills at [facility].  He is in the process of moving to [place]  but does not wish to transfer his care down there. He provided conflicting information today when asked how many doses of Dovato  he usually misses during a week. He initially said one dose per week but then stated he takes it off and on. He has 3 full bottles of Dovato  on hand from the pharmacy. I asked him how this was possible, and he said the pharmacy sent him 2 bottles once. He then told me that he left it at home when visiting [place]  a few months ago because he thought he would be stopped by TSA. He now knows that you can take prescription medications on  an airplane.   I spent awhile discussing the risk of resistance developing if he continues down this path and would possibly lead to him needing to take multiple pills multiple times a day to control his virus. I went over his labs with him from June and asked him if he knew what we wanted his viral load to be. He responded with low, and I confirmed that we wanted it to be undetectable (less than 50). He states that the reason he doesn't take it that one day per week is because he has spaghetti and it doesn't work well when he takes his Dovato  and eats that for dinner. Asked him to try and  take it earlier in the day with breakfast or lunch if he develops nausea or heart burn when he takes it with spaghetti. Again, reiterated the importance of staying on Dovato  consistently.  Eligible vaccinations: tdap, Hepatitis B, Menveo, Shingles, Flu, COVID, and Prevnar 20. Declines all of these today.  Labs: HIV RNA with relfex and CD4 count; declines STI testing.  Will have him follow up with Cathlyn in ~1 month for a check in.   Plan: - Continue Dovato  - take every day - HIV RNA with reflex to genotype and CD4 count today - Follow up with Cathlyn on 07/27/24  Roger Washington, PharmD, BCIDP, AAHIVP, CPP Clinical Pharmacist Practitioner - Infectious Diseases Clinical Pharmacist Lead - Specialty Pharmacy [facility] for Infectious Disease

## 2024-06-30 ENCOUNTER — Ambulatory Visit (INDEPENDENT_AMBULATORY_CARE_PROVIDER_SITE_OTHER): Payer: MEDICAID | Admitting: Pharmacist

## 2024-06-30 ENCOUNTER — Other Ambulatory Visit: Payer: Self-pay

## 2024-06-30 DIAGNOSIS — B20 Human immunodeficiency virus [HIV] disease: Secondary | ICD-10-CM

## 2024-06-30 DIAGNOSIS — Z113 Encounter for screening for infections with a predominantly sexual mode of transmission: Secondary | ICD-10-CM

## 2024-07-01 ENCOUNTER — Telehealth: Payer: Self-pay

## 2024-07-01 ENCOUNTER — Other Ambulatory Visit: Payer: Self-pay

## 2024-07-01 LAB — T-HELPER CELLS (CD4) COUNT (NOT AT ARMC)
CD4 % Helper T Cell: 25 % — ABNORMAL LOW (ref 33–65)
CD4 T Cell Abs: 248 /uL — ABNORMAL LOW (ref 400–1790)

## 2024-07-01 NOTE — Telephone Encounter (Signed)
 Tried to return call to patient, voicemail not set up. Patient was calling about Medicaid coverage. He will need to contact his case worker to have it changed from inpt services only to full medicaid.

## 2024-07-06 ENCOUNTER — Ambulatory Visit: Payer: Self-pay | Admitting: Nurse Practitioner

## 2024-07-08 ENCOUNTER — Other Ambulatory Visit: Payer: Self-pay

## 2024-07-09 ENCOUNTER — Other Ambulatory Visit: Payer: Self-pay | Admitting: Pharmacist

## 2024-07-09 ENCOUNTER — Ambulatory Visit: Payer: Self-pay | Admitting: Pharmacist

## 2024-07-09 LAB — HIV-1 INTEGRASE GENOTYPE

## 2024-07-09 LAB — HIV RNA, RTPCR W/R GT (RTI, PI,INT)
HIV 1 RNA Quant: 12200 {copies}/mL — ABNORMAL HIGH
HIV-1 RNA Quant, Log: 4.09 {Log_copies}/mL — ABNORMAL HIGH

## 2024-07-09 LAB — HIV-1 GENOTYPE: HIV-1 Genotype: DETECTED — AB

## 2024-07-09 NOTE — Progress Notes (Signed)
 Specialty Pharmacy Ongoing Clinical Assessment Note  Roger Washington is a 23 y.o. male who is being followed by the specialty pharmacy service for RxSp HIV   Patient's specialty medication(s) reviewed today: Dolutegravir -lamiVUDine  (Dovato )   Missed doses in the last 4 weeks: More than 5   Patient/Caregiver did not have any additional questions or concerns.   Therapeutic benefit summary: Patient is NOT achieving benefit   Adverse events/side effects summary: No adverse events/side effects   Patient's therapy is appropriate to: Continue    Goals Addressed             This Visit's Progress    Achieve Undetectable HIV Viral Load < 20   Worsening    Patient is not on track and worsening. Patient will work on increased adherence      Increase CD4 count until steady state   Worsening    Patient is on track . Patient will work on increased adherence      Maintain optimal adherence to therapy   Worsening    Patient is on track . Patient will work on increased adherence         Roger Washington is not taking his medications like he should. It was clear from our meeting on 10/8. His viral load has worsened and is 12,200 indicating that he is not taking his medications at all. Will continue to follow closely. He has a follow up appointment on 11/4 with Cathlyn July, ID NP.   Follow up: 3 months  Roger Washington L. Roger Washington, PharmD, BCIDP, AAHIVP, CPP Infectious Diseases Clinical Pharmacist Practitioner Clinical Pharmacist Lead, Specialty Pharmacy [facility] for Infectious Disease

## 2024-07-09 NOTE — Progress Notes (Signed)
 Not taking his medications.. I could tell from our chat. Low level resistance to elvitegravir and raltegravir. Sees you on 11/4.

## 2024-07-27 ENCOUNTER — Ambulatory Visit: Payer: MEDICAID | Admitting: Family

## 2024-08-12 ENCOUNTER — Ambulatory Visit: Payer: Self-pay | Admitting: Infectious Diseases

## 2024-09-09 ENCOUNTER — Ambulatory Visit: Payer: Self-pay | Admitting: Infectious Diseases

## 2024-09-10 ENCOUNTER — Ambulatory Visit: Payer: MEDICAID | Admitting: Nurse Practitioner

## 2024-09-21 ENCOUNTER — Other Ambulatory Visit: Payer: Self-pay

## 2024-10-01 ENCOUNTER — Other Ambulatory Visit: Payer: Self-pay | Admitting: Pharmacist

## 2024-10-01 NOTE — Progress Notes (Signed)
 Inactivating as patient has missed multiple appointments and has not filled medication since August 2025.  Alan Geralds, PharmD, CPP, BCIDP, AAHIVP Clinical Pharmacist Practitioner Infectious Diseases Clinical Pharmacist [facility] for Infectious Disease
